# Patient Record
Sex: Female | Born: 1995 | Race: Black or African American | Hispanic: No | Marital: Single | Smoking: Never smoker
Health system: Southern US, Community
[De-identification: ages and names within clinical notes are randomized; demographics above are authoritative.]

## PROBLEM LIST (undated history)

## (undated) ENCOUNTER — Ambulatory Visit: Discharge status: Absent without leave | Payer: Medicaid Other

## (undated) DIAGNOSIS — L732 Hidradenitis suppurativa: Secondary | ICD-10-CM

## (undated) HISTORY — DX: Hidradenitis suppurativa: L73.2

---
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
## 2015-08-09 IMAGING — CR DG CHEST 2V
2 series · 2 of 2 positions shown · non-contrast
Comparison: None.

CLINICAL DATA: Mid chest pain.

EXAM:
CHEST  2 VIEW

[w chest pa]
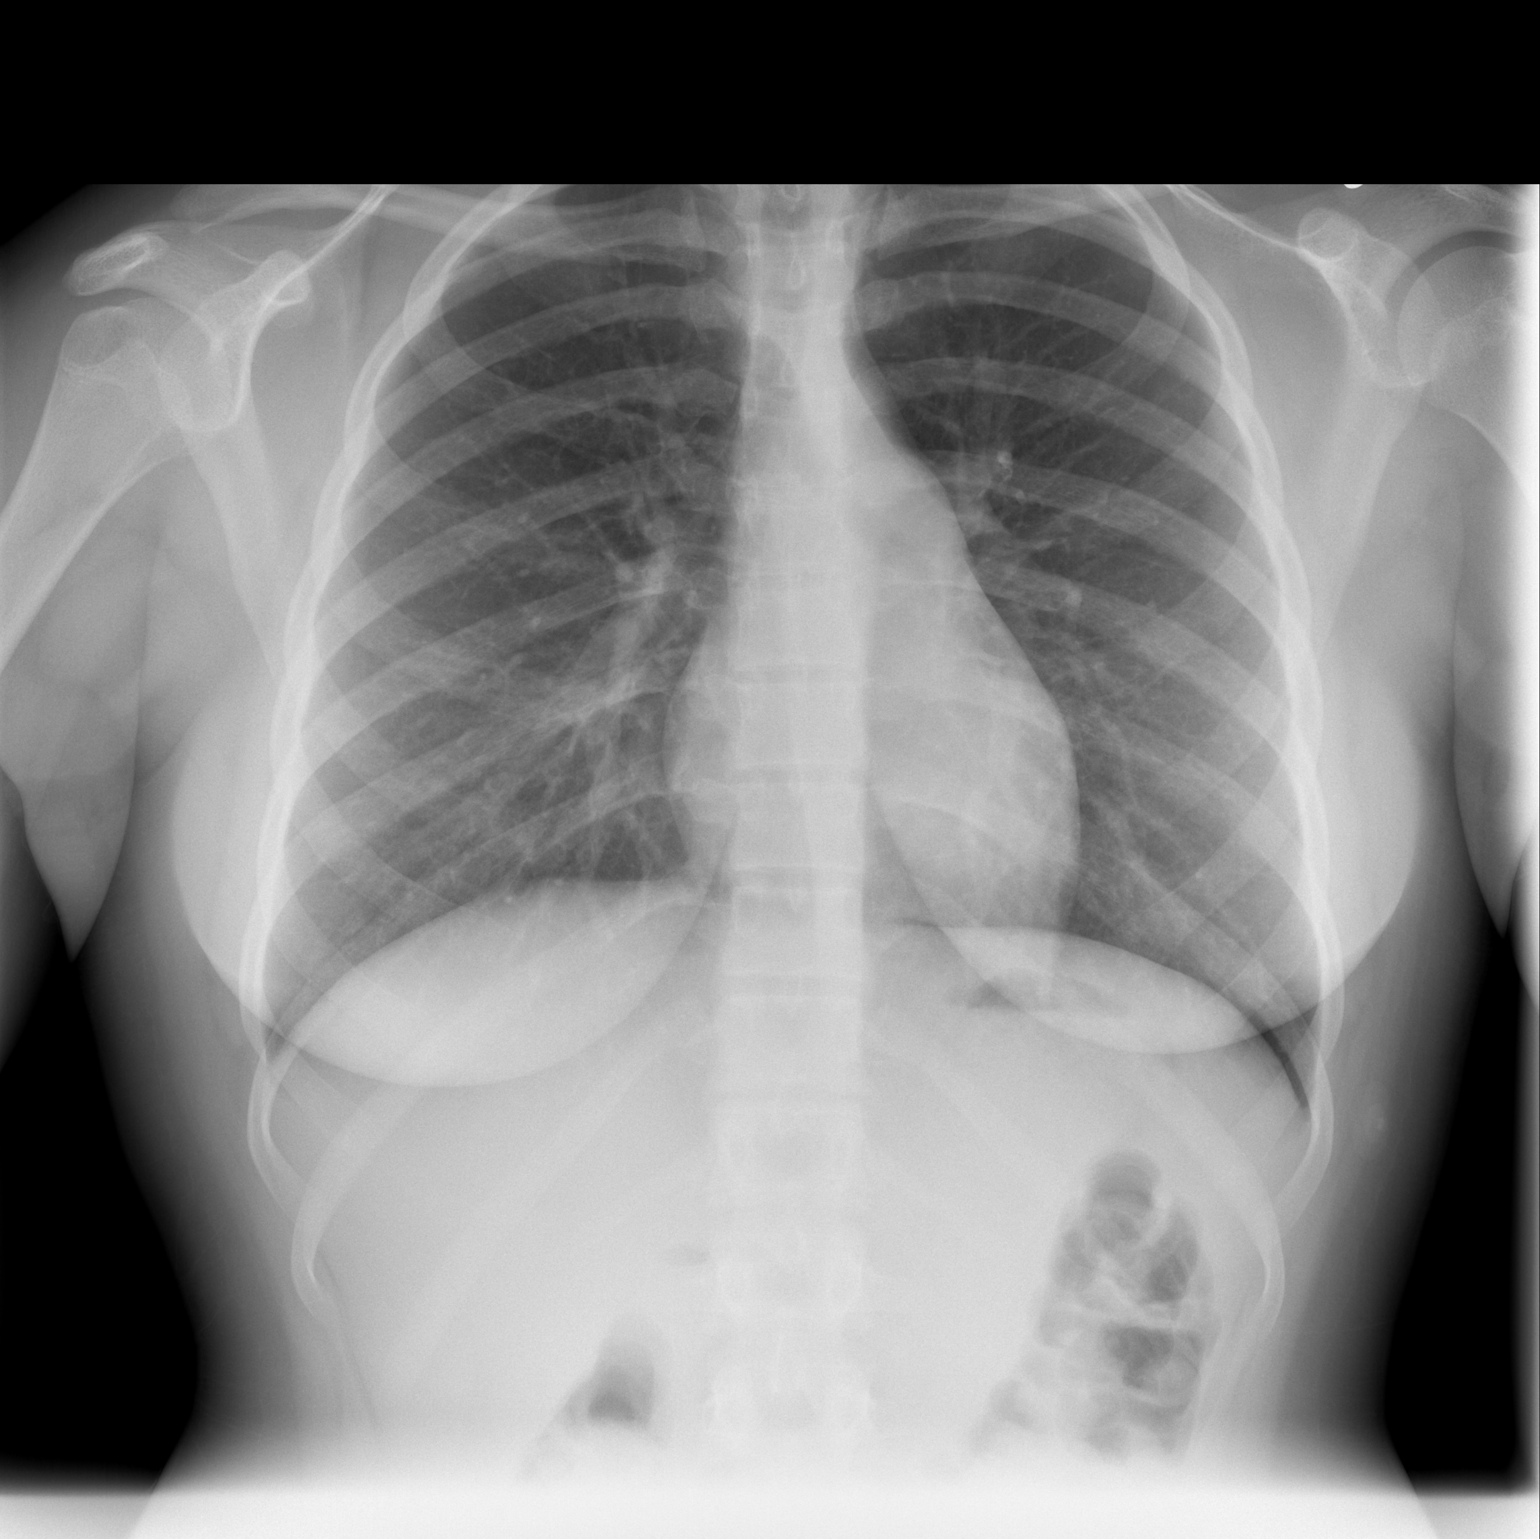

[w chest lat]
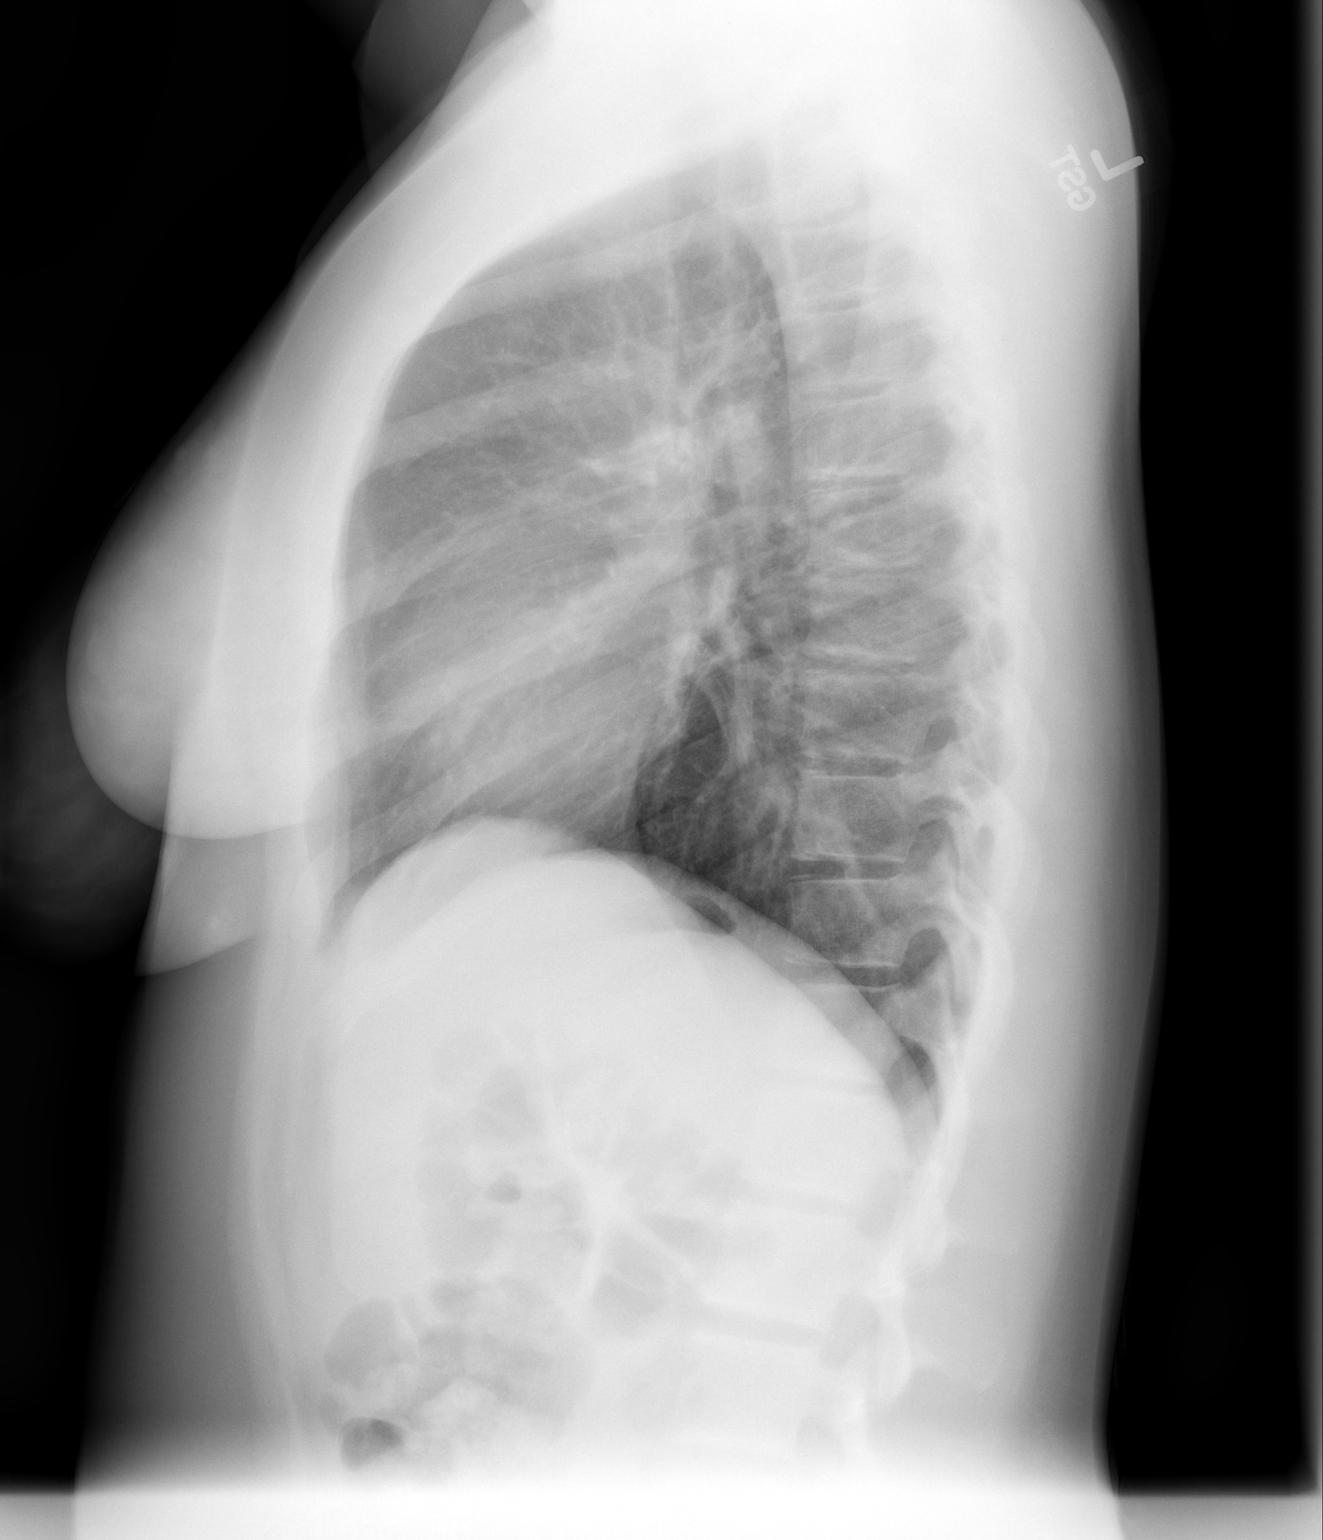

[2 of 2 positions shown; findings below may reference images not displayed]

FINDINGS: The lungs are well-aerated and clear. There is no evidence of focal
opacification, pleural effusion or pneumothorax.

The heart is normal in size; the mediastinal contour is within
normal limits. No acute osseous abnormalities are seen.
IMPRESSION: No acute cardiopulmonary process seen.

## 2015-10-29 ENCOUNTER — Ambulatory Visit (HOSPITAL_COMMUNITY)
Admission: EM | Admit: 2015-10-29 | Discharge: 2015-10-29 | Disposition: A | Discharge status: Home / Self Care | Payer: 59 | Attending: Internal Medicine | Admitting: Internal Medicine

## 2015-10-29 ENCOUNTER — Encounter (HOSPITAL_COMMUNITY): Payer: Self-pay | Admitting: Emergency Medicine

## 2015-10-29 DIAGNOSIS — R350 Frequency of micturition: Secondary | ICD-10-CM | POA: Insufficient documentation

## 2015-10-29 DIAGNOSIS — N898 Other specified noninflammatory disorders of vagina: Secondary | ICD-10-CM

## 2015-10-29 DIAGNOSIS — R109 Unspecified abdominal pain: Secondary | ICD-10-CM | POA: Diagnosis present

## 2015-10-29 LAB — POCT PREGNANCY, URINE: Preg Test, Ur: NEGATIVE

## 2015-10-29 LAB — POCT URINALYSIS DIP (DEVICE)
Glucose, UA: NEGATIVE mg/dL
HGB URINE DIPSTICK: NEGATIVE
KETONES UR: NEGATIVE mg/dL
Leukocytes, UA: NEGATIVE
Nitrite: NEGATIVE
PH: 6 (ref 5.0–8.0)
Protein, ur: NEGATIVE mg/dL
SPECIFIC GRAVITY, URINE: 1.025 (ref 1.005–1.030)
Urobilinogen, UA: 1 mg/dL (ref 0.0–1.0)

## 2015-10-29 MED ORDER — FLUCONAZOLE 150 MG PO TABS: 150.0000 mg | ORAL_TABLET | Freq: Every day | ORAL | 1 refills | Status: DC

## 2015-10-29 NOTE — ED Triage Notes (Signed)
The patient presented to the [facility] with a complaint of lower abdominal pain and urinary frequency for about 1 week. The patient also requested an STD check.

## 2015-10-29 NOTE — ED Provider Notes (Addendum)
[facility]    CSN: [id number] Arrival date & time: 10/29/15  1452  First Provider Contact:  None       History   Chief Complaint Chief Complaint  Patient presents with  . Abdominal Pain  . Urinary Frequency    HPI Shelly Mccann is a 20 y.o. female.   The patient complains of pelvic discomfort and white discharge x4 days.  She is sexually active.  Denies nausea, vomiting or diarrhea.  She thinks she has seen blood in her urine and admits to urinary frequency and urgency.   The history is provided by the patient.  Abdominal Pain  Pain location:  Suprapubic Pain quality: aching   Pain radiates to:  Does not radiate Onset quality:  Unable to specify Progression:  Unchanged Chronicity:  New Worsened by:  Urination Ineffective treatments:  None tried Associated symptoms: no diarrhea, no fever, no nausea and no vomiting   Urinary Frequency  Associated symptoms include abdominal pain.    History reviewed. No pertinent past medical history.  Patient Active Problem List   Diagnosis Date Noted  . S/P tonsillectomy 10/03/2013    Past Surgical History:  Procedure Laterality Date  . TONSILLECTOMY N/A 10/03/2013   Procedure: TONSILLECTOMY;  Surgeon: Serena Colonel, MD;  Location: [facility];  Service: ENT;  Laterality: N/A;    OB History    No data available       Home Medications    Prior to Admission medications   Medication Sig Start Date End Date Taking? Authorizing Provider  fluconazole (DIFLUCAN) 150 MG tablet Take 1 tablet (150 mg total) by mouth daily. Take 1 tab today, then take the last tablet AFTER completing antibiotic regimen 10/29/15   Arnaldo Natal, MD  HYDROcodone-acetaminophen (HYCET) 7.5-325 mg/15 ml solution Take 15 mLs by mouth 4 (four) times daily as needed for moderate pain. 10/03/13   Serena Colonel, MD  promethazine (PHENERGAN) 25 MG suppository Place 1 suppository (25 mg total) rectally every 6 (six) hours as needed for  nausea or vomiting. 10/03/13   Serena Colonel, MD    Family History Family History  Problem Relation Age of Onset  . Alcohol abuse Paternal Grandfather   . Hypertension Paternal Grandfather     Social History Social History  Substance Use Topics  . Smoking status: Never Smoker  . Smokeless tobacco: Never Used  . Alcohol use No     Allergies   Review of patient's allergies indicates no known allergies.   Review of Systems Review of Systems  Constitutional: Negative for fever.  Gastrointestinal: Positive for abdominal pain. Negative for diarrhea, nausea and vomiting.  Genitourinary: Positive for frequency.     Physical Exam Triage Vital Signs ED Triage Vitals [10/29/15 1522]  Enc Vitals Group     BP 112/73     Pulse Rate 83     Resp 12     Temp 97.6 F (36.4 C)     Temp Source Oral     SpO2 100 %     Weight      Height      Head Circumference      Peak Flow      Pain Score      Pain Loc      Pain Edu?      Excl. in GC?    No data found.   Updated Vital Signs BP 112/73 (BP Location: Left Arm)   Pulse 83   Temp 97.6 F (36.4  C) (Oral)   Resp 12   LMP 10/08/2015 (Exact Date)   SpO2 100%   Visual Acuity Right Eye Distance:   Left Eye Distance:   Bilateral Distance:    Right Eye Near:   Left Eye Near:    Bilateral Near:     Physical Exam  Constitutional: She appears well-developed and well-nourished. No distress.  HENT:  Head: Normocephalic and atraumatic.  Eyes: Conjunctivae are normal.  Neck: Neck supple.  Cardiovascular: Normal rate and regular rhythm.   No murmur heard. Pulmonary/Chest: Effort normal and breath sounds normal. No respiratory distress.  Abdominal: Soft. She exhibits no distension. There is no tenderness.  Genitourinary: Vagina normal. Vaginal discharge: white milky.  Musculoskeletal: She exhibits no edema.  Neurological: She is alert.  Skin: Skin is warm and dry.  Psychiatric: She has a normal mood and affect.  Nursing  note and vitals reviewed.    UC Treatments / Results  Labs (all labs ordered are listed, but only abnormal results are displayed) Labs Reviewed  POCT URINALYSIS DIP (DEVICE) - Abnormal; Notable for the following:       Result Value   Bilirubin Urine SMALL (*)    All other components within normal limits  CERVICOVAGINAL ANCILLARY ONLY - Abnormal; Notable for the following:    Wet Prep (BD Affirm) **POSITIVE for Gardnerella** (*)    All other components within normal limits  HIV ANTIBODY (ROUTINE TESTING)  POCT PREGNANCY, URINE  CERVICOVAGINAL ANCILLARY ONLY    EKG  EKG Interpretation None       Radiology No results found.  Procedures Procedures (including critical care time)  Medications Ordered in UC Medications - No data to display   Initial Impression / Assessment and Plan / UC Course  I have reviewed the triage vital signs and the nursing notes.  Pertinent labs & imaging results that were available during my care of the patient were reviewed by me and considered in my medical decision making (see chart for details).  Clinical Course    Awaiting wet prep and vaginal cultures for GC/Chlamydia. Highly likely to have yeast infection.  Given Diflucan for today and for after antibiotics (assuming she needs a course).  Will call in medications pending results of swabs.  Prep positive for bacterial vaginosis. Flagyl 500 mg by mouth twice a day called in to [facility]. Final Clinical Impressions(s) / UC Diagnoses   Final diagnoses:  Vaginal discharge    New Prescriptions Discharge Medication List as of 10/29/2015  5:25 PM    START taking these medications   Details  fluconazole (DIFLUCAN) 150 MG tablet Take 1 tablet (150 mg total) by mouth daily. Take 1 tab today, then take the last tablet AFTER completing antibiotic regimen, Starting Mon 10/29/2015, Print         Arnaldo Natal, MD 10/30/15 0144    Arnaldo Natal, MD 11/01/15  [phone]

## 2015-10-30 LAB — HIV ANTIBODY (ROUTINE TESTING W REFLEX): HIV Screen 4th Generation wRfx: NONREACTIVE

## 2015-10-30 LAB — CERVICOVAGINAL ANCILLARY ONLY
Chlamydia: NEGATIVE
Neisseria Gonorrhea: NEGATIVE
Wet Prep (BD Affirm): POSITIVE — AB

## 2015-11-01 ENCOUNTER — Telehealth (HOSPITAL_COMMUNITY): Payer: Self-pay | Admitting: *Deleted

## 2015-11-01 MED ORDER — METRONIDAZOLE 500 MG PO TABS: 500.0000 mg | ORAL_TABLET | Freq: Two times a day (BID) | ORAL | 0 refills | Status: DC

## 2019-06-16 ENCOUNTER — Emergency Department (HOSPITAL_COMMUNITY)
Admission: EM | Admit: 2019-06-16 | Discharge: 2019-06-16 | Disposition: A | Discharge status: Home / Self Care | Payer: Medicaid - Out of State | Attending: Emergency Medicine | Admitting: Emergency Medicine

## 2019-06-16 ENCOUNTER — Other Ambulatory Visit: Payer: Self-pay

## 2019-06-16 ENCOUNTER — Encounter (HOSPITAL_COMMUNITY): Payer: Self-pay | Admitting: Emergency Medicine

## 2019-06-16 DIAGNOSIS — N926 Irregular menstruation, unspecified: Secondary | ICD-10-CM | POA: Diagnosis not present

## 2019-06-16 DIAGNOSIS — N939 Abnormal uterine and vaginal bleeding, unspecified: Secondary | ICD-10-CM | POA: Diagnosis present

## 2019-06-16 LAB — CBC
HCT: 42.6 % (ref 36.0–46.0)
Hemoglobin: 14.3 g/dL (ref 12.0–15.0)
MCH: 30.6 pg (ref 26.0–34.0)
MCHC: 33.6 g/dL (ref 30.0–36.0)
MCV: 91 fL (ref 80.0–100.0)
Platelets: 215 10*3/uL (ref 150–400)
RBC: 4.68 MIL/uL (ref 3.87–5.11)
RDW: 12.1 % (ref 11.5–15.5)
WBC: 8.2 10*3/uL (ref 4.0–10.5)
nRBC: 0 % (ref 0.0–0.2)

## 2019-06-16 LAB — BASIC METABOLIC PANEL
Anion gap: 7 (ref 5–15)
BUN: 10 mg/dL (ref 6–20)
CO2: 27 mmol/L (ref 22–32)
Calcium: 9.1 mg/dL (ref 8.9–10.3)
Chloride: 105 mmol/L (ref 98–111)
Creatinine, Ser: 0.73 mg/dL (ref 0.44–1.00)
GFR calc Af Amer: >60 mL/min (ref >60)
GFR calc non Af Amer: >60 mL/min (ref >60)
Glucose, Bld: 108 mg/dL — ABNORMAL HIGH (ref 70–99)
Potassium: 3.4 mmol/L — ABNORMAL LOW (ref 3.5–5.1)
Sodium: 139 mmol/L (ref 135–145)

## 2019-06-16 LAB — ABO/RH: ABO/RH(D): A POS

## 2019-06-16 LAB — WET PREP, GENITAL
Clue Cells Wet Prep HPF POC: NONE SEEN
Sperm: NONE SEEN
Trich, Wet Prep: NONE SEEN
Yeast Wet Prep HPF POC: NONE SEEN

## 2019-06-16 LAB — HCG, QUANTITATIVE, PREGNANCY: hCG, Beta Chain, Quant, S: 1 m[IU]/mL (ref <5)

## 2019-06-16 NOTE — Discharge Instructions (Addendum)
Your blood test was negative for pregnancy. This is likely your normal menstrual cycle. Get help right away if: You develop a fever or chills. You need to change your sanitary pad or tampon more than one time per hour. Your bleeding becomes heavy. Your flow contains clots. You develop pain in your abdomen. You lose consciousness. You develop a rash.

## 2019-06-16 NOTE — ED Triage Notes (Signed)
Had positive pregnancy last week and has had some spotting since.

## 2019-06-16 NOTE — ED Provider Notes (Signed)
[facility] Provider Note   CSN: [id number] Arrival date & time: 06/16/19  1144     History No chief complaint on file.   Shelly Mccann is a 24 y.o. female.  HPI  Shelly Mccann is a 24 y.o. female Here with Pos preg test last Friday and c/o vaginal bleeding starting on  Saturday the 11. She began having light and cramping. Bleeding has steadily decreased. Using 2 panty liners a day. She has had 1 previous pregnancy resulting in miscarriage. She denies urinary symptoms. No hx of STI's.  LMP Feb. 7, 2021.     History reviewed. No pertinent past medical history.  Patient Active Problem List   Diagnosis Date Noted  . S/P tonsillectomy 10/03/2013    Past Surgical History:  Procedure Laterality Date  . TONSILLECTOMY N/A 10/03/2013   Procedure: TONSILLECTOMY;  Surgeon: Izora Gala, MD;  Location: [facility];  Service: ENT;  Laterality: N/A;     OB History   No obstetric history on file.     Family History  Problem Relation Age of Onset  . Alcohol abuse Paternal Grandfather   . Hypertension Paternal Grandfather     Social History   Tobacco Use  . Smoking status: Never Smoker  . Smokeless tobacco: Never Used  Substance Use Topics  . Alcohol use: No  . Drug use: Not Currently    Types: Marijuana    Home Medications Prior to Admission medications   Medication Sig Start Date End Date Taking? Authorizing Provider  fluconazole (DIFLUCAN) 150 MG tablet Take 1 tablet (150 mg total) by mouth daily. Take 1 tab today, then take the last tablet AFTER completing antibiotic regimen Patient not taking: Reported on 06/16/2019 10/29/15   Harrie Foreman, MD  HYDROcodone-acetaminophen (HYCET) 7.5-325 mg/15 ml solution Take 15 mLs by mouth 4 (four) times daily as needed for moderate pain. Patient not taking: Reported on 06/16/2019 10/03/13   Izora Gala, MD  metroNIDAZOLE (FLAGYL) 500 MG tablet Take 1 tablet (500 mg total) by mouth 2 (two)  times daily. Patient not taking: Reported on 06/16/2019 11/01/15   Melony Overly, MD  promethazine (PHENERGAN) 25 MG suppository Place 1 suppository (25 mg total) rectally every 6 (six) hours as needed for nausea or vomiting. Patient not taking: Reported on 06/16/2019 10/03/13   Izora Gala, MD    Allergies    Patient has no known allergies.  Review of Systems   Review of Systems Ten systems reviewed and are negative for acute change, except as noted in the HPI.   Physical Exam Updated Vital Signs BP (!) 104/94   Pulse 61   Temp 98.3 F (36.8 C) (Oral)   Resp 15   Ht 5\' 9"  (1.753 m)   Wt 104.3 kg   LMP 05/15/2019   SpO2 97%   BMI 33.97 kg/m   Physical Exam Vitals and nursing note reviewed.  Constitutional:      General: She is not in acute distress.    Appearance: She is well-developed. She is not diaphoretic.  HENT:     Head: Normocephalic and atraumatic.  Eyes:     General: No scleral icterus.    Conjunctiva/sclera: Conjunctivae normal.  Cardiovascular:     Rate and Rhythm: Normal rate and regular rhythm.     Heart sounds: Normal heart sounds. No murmur. No friction rub. No gallop.   Pulmonary:     Effort: Pulmonary effort is normal. No respiratory distress.  Breath sounds: Normal breath sounds.  Abdominal:     General: Bowel sounds are normal. There is no distension.     Palpations: Abdomen is soft. There is no mass.     Tenderness: There is no abdominal tenderness. There is no guarding.  Genitourinary:    Comments: Exam chaperoned Pelvic exam: normal external genitalia, vulva, vagina, cervix, uterus and adnexa. Mild bleeding from cervical os. No clots. Musculoskeletal:     Cervical back: Normal range of motion.  Skin:    General: Skin is warm and dry.  Neurological:     Mental Status: She is alert and oriented to person, place, and time.  Psychiatric:        Behavior: Behavior normal.     ED Results / Procedures / Treatments   Labs (all labs ordered  are listed, but only abnormal results are displayed) Labs Reviewed  CBC  HCG, QUANTITATIVE, PREGNANCY  BASIC METABOLIC PANEL  ABO/RH  GC/CHLAMYDIA PROBE AMP ([place]) NOT AT [facility]    EKG None  Radiology No results found.  Procedures Procedures (including critical care time)  Medications Ordered in ED Medications - No data to display  ED Course  I have reviewed the triage vital signs and the nursing notes.  Pertinent labs & imaging results that were available during my care of the patient were reviewed by me and considered in my medical decision making (see chart for details).  Clinical Course as of Jun 15 2105  Thu Jun 16, 2019  1408 HCG, Beta Chain, Quant, S: 1 [AH]  1409 HCG, Beta Chain, Quant, S: 1 [AH]    Clinical Course User Index [AH] Arthor Captain, PA-C   MDM Rules/Calculators/A&P                      24 year old female presents emergency department with chief complaint of vaginal bleeding.  She had a positive pregnancy test at home however review of the labs shows negative quantitative hCG patient had a few white cells on her wet prep but no other abnormalities.  She has a benign pelvic examination without pain.  Likely the patient bleeding is just normal menstruation.  Given her negative pregnancy test there is no concern for ectopic pregnancy, subchorionic hemorrhage or other early cause of first trimester bleeding.  Patient also has a benign pressure exam so I have low suspicion for PID or tubo-ovarian abscess.  She has no pelvic pain.  Patient appears appropriate for discharge at this time with outpatient follow-up.  Remainder of the patient's labs are within normal limits. Final Clinical Impression(s) / ED Diagnoses Final diagnoses:  None    Rx / DC Orders ED Discharge Orders    None       Arthor Captain, PA-C 06/16/19 2110    Pollyann Savoy, MD 06/17/19 [phone]

## 2019-06-17 LAB — GC/CHLAMYDIA PROBE AMP (~~LOC~~) NOT AT ARMC
Chlamydia: NEGATIVE
Neisseria Gonorrhea: NEGATIVE

## 2019-11-07 ENCOUNTER — Ambulatory Visit
Admission: EM | Admit: 2019-11-07 | Discharge: 2019-11-07 | Disposition: A | Discharge status: Home / Self Care | Payer: Medicaid - Out of State | Attending: Emergency Medicine | Admitting: Emergency Medicine

## 2019-11-07 ENCOUNTER — Other Ambulatory Visit: Payer: Self-pay

## 2019-11-07 DIAGNOSIS — N898 Other specified noninflammatory disorders of vagina: Secondary | ICD-10-CM | POA: Diagnosis present

## 2019-11-07 DIAGNOSIS — Z113 Encounter for screening for infections with a predominantly sexual mode of transmission: Secondary | ICD-10-CM | POA: Diagnosis present

## 2019-11-07 MED ORDER — FLUCONAZOLE 200 MG PO TABS
ORAL_TABLET | ORAL | 0 refills | Status: DC
Start: 2019-11-07 — End: 2020-03-12

## 2019-11-07 MED ORDER — METRONIDAZOLE 500 MG PO TABS: 500.0000 mg | ORAL_TABLET | Freq: Two times a day (BID) | ORAL | 0 refills | Status: DC

## 2019-11-07 NOTE — ED Triage Notes (Signed)
Pt presents to UC for STD's testing. State shaving vaginal discharge.

## 2019-11-07 NOTE — Discharge Instructions (Signed)
Vaginal self-swab obtained.  We will follow up with you regarding abnormal results HIV/ syphilis testing today Prescribed metronidazole 500 mg twice daily for 7 days (do not take while consuming alcohol and/or if breastfeeding) Prescribed diflucan 200 mg once daily and then second dose 72 hours later Take medications as prescribed and to completion If tests results are positive, please abstain from sexual activity until you and your partner(s) have been treated Follow up with PCP as needed Return here or go to ER if you have any new or worsening symptoms fever, chills, nausea, vomiting, abdominal or pelvic pain, painful intercourse, vaginal discharge, vaginal bleeding, persistent symptoms despite treatment, etc..Marland Kitchen

## 2019-11-07 NOTE — ED Provider Notes (Signed)
[facility] CARE CENTER   [id number] 11/07/19 Arrival Time: 1831   CC: VAGINAL DISCHARGE  SUBJECTIVE:  Shelly Mccann is a 24 y.o. female who presents with complaints of vaginal discharge and odor x 1 week.  Last sex 2 weeks ago.  Has been sexually active with 1 female partner over the past 6 months.  Partner without symptoms.  She has tried OTC medications without relief.  She reports worsening symptoms with sensitive soap.  She reports similar symptoms in the past and was diagnosed with BV and yeast.  She denies fever, chills, nausea, vomiting, abdominal or pelvic pain, urinary symptoms, vaginal itching, vaginal bleeding, dyspareunia, vaginal rashes or lesions.   LMP: 10/24/19 Not on BC  ROS: As per HPI.  All other pertinent ROS negative.     History reviewed. No pertinent past medical history. Past Surgical History:  Procedure Laterality Date  . TONSILLECTOMY N/A 10/03/2013   Procedure: TONSILLECTOMY;  Surgeon: Serena Colonel, MD;  Location: [facility];  Service: ENT;  Laterality: N/A;   No Known Allergies No current facility-administered medications on file prior to encounter.   Current Outpatient Medications on File Prior to Encounter  Medication Sig Dispense Refill  . [DISCONTINUED] promethazine (PHENERGAN) 25 MG suppository Place 1 suppository (25 mg total) rectally every 6 (six) hours as needed for nausea or vomiting. (Patient not taking: Reported on 06/16/2019) 12 suppository 1    Social History   Socioeconomic History  . Marital status: Single    Spouse name: Not on file  . Number of children: Not on file  . Years of education: Not on file  . Highest education level: Not on file  Occupational History  . Not on file  Tobacco Use  . Smoking status: Never Smoker  . Smokeless tobacco: Never Used  Substance and Sexual Activity  . Alcohol use: No  . Drug use: Not Currently    Types: Marijuana  . Sexual activity: Never    Birth control/protection: Implant     Comment: pt has norplant in arm for birth control  Other Topics Concern  . Not on file  Social History Narrative  . Not on file   Social Determinants of Health   Financial Resource Strain:   . Difficulty of Paying Living Expenses:   Food Insecurity:   . Worried About Programme researcher, broadcasting/film/video in the Last Year:   . Barista in the Last Year:   Transportation Needs:   . Freight forwarder (Medical):   Marland Kitchen Lack of Transportation (Non-Medical):   Physical Activity:   . Days of Exercise per Week:   . Minutes of Exercise per Session:   Stress:   . Feeling of Stress :   Social Connections:   . Frequency of Communication with Friends and Family:   . Frequency of Social Gatherings with Friends and Family:   . Attends Religious Services:   . Active Member of Clubs or Organizations:   . Attends Banker Meetings:   Marland Kitchen Marital Status:   Intimate Partner Violence:   . Fear of Current or Ex-Partner:   . Emotionally Abused:   Marland Kitchen Physically Abused:   . Sexually Abused:    Family History  Problem Relation Age of Onset  . Alcohol abuse Paternal Grandfather   . Hypertension Paternal Grandfather     OBJECTIVE:  Vitals:   11/07/19 1853  BP: 111/76  Pulse: (!) 57  Resp: 16  Temp: 98.3 F (36.8 C)  TempSrc:  Oral  SpO2: 98%     General appearance: Alert, NAD, appears stated age Head: NCAT Throat: lips, mucosa, and tongue normal; teeth and gums normal Lungs: CTA bilaterally without adventitious breath sounds Heart: regular rate and rhythm.   Back: no CVA tenderness Abdomen: soft, non-tender; bowel sounds normal; no guarding or rebound tenderness GU: deferred Skin: warm and dry Psychological:  Alert and cooperative. Normal mood and affect.  ASSESSMENT & PLAN:  1. Vaginal discharge   2. Vaginal odor   3. Screening examination for venereal disease     Meds ordered this encounter  Medications  . metroNIDAZOLE (FLAGYL) 500 MG tablet    Sig: Take 1 tablet (500  mg total) by mouth 2 (two) times daily.    Dispense:  14 tablet    Refill:  0    Order Specific Question:   Supervising Provider    Answer:   Eustace Moore [[id number]]  . fluconazole (DIFLUCAN) 200 MG tablet    Sig: Take one dose by mouth, wait 72 hours, and then take second dose by mouth    Dispense:  2 tablet    Refill:  0    Order Specific Question:   Supervising Provider    Answer:   Eustace Moore [[id number]]    Vaginal self-swab obtained.  We will follow up with you regarding abnormal results HIV/ syphilis testing today Prescribed metronidazole 500 mg twice daily for 7 days (do not take while consuming alcohol and/or if breastfeeding) Prescribed diflucan 200 mg once daily and then second dose 72 hours later Take medications as prescribed and to completion If tests results are positive, please abstain from sexual activity until you and your partner(s) have been treated Follow up with PCP as needed Return here or go to ER if you have any new or worsening symptoms fever, chills, nausea, vomiting, abdominal or pelvic pain, painful intercourse, vaginal discharge, vaginal bleeding, persistent symptoms despite treatment, etc...  Reviewed expectations re: course of current medical issues. Questions answered. Outlined signs and symptoms indicating need for more acute intervention. Patient verbalized understanding. After Visit Summary given.       Rennis Harding, PA-C 11/07/19 1901

## 2019-11-08 LAB — CERVICOVAGINAL ANCILLARY ONLY
Bacterial Vaginitis (gardnerella): POSITIVE — AB
Candida Glabrata: NEGATIVE
Candida Vaginitis: NEGATIVE
Chlamydia: NEGATIVE
Comment: NEGATIVE
Comment: NEGATIVE
Comment: NEGATIVE
Comment: NEGATIVE
Comment: NEGATIVE
Comment: NORMAL
Neisseria Gonorrhea: NEGATIVE
Trichomonas: NEGATIVE

## 2019-11-08 LAB — HIV ANTIBODY (ROUTINE TESTING W REFLEX): HIV Screen 4th Generation wRfx: NONREACTIVE

## 2019-11-08 LAB — RPR: RPR Ser Ql: NONREACTIVE

## 2020-01-28 ENCOUNTER — Encounter: Payer: Self-pay | Admitting: Emergency Medicine

## 2020-01-28 ENCOUNTER — Other Ambulatory Visit: Payer: Self-pay

## 2020-01-28 ENCOUNTER — Ambulatory Visit
Admission: EM | Admit: 2020-01-28 | Discharge: 2020-01-28 | Disposition: A | Discharge status: Home / Self Care | Payer: Medicaid Other | Attending: Emergency Medicine | Admitting: Emergency Medicine

## 2020-01-28 DIAGNOSIS — Z206 Contact with and (suspected) exposure to human immunodeficiency virus [HIV]: Secondary | ICD-10-CM | POA: Diagnosis present

## 2020-01-28 DIAGNOSIS — Z113 Encounter for screening for infections with a predominantly sexual mode of transmission: Secondary | ICD-10-CM | POA: Diagnosis present

## 2020-01-28 NOTE — ED Triage Notes (Signed)
Patient states that she is in the middle of having an abortion now- taking the pills now. just wants to get checked for stds. no current symptoms. wants to do the swab

## 2020-01-28 NOTE — Discharge Instructions (Addendum)
Vaginal self swab obtained Take medications as prescribed and to completion We will follow up with you regarding the results of your test If tests are positive, please abstain from sexual activity until you and your partner(s) are treated Follow up with PCP or [facility] if symptoms persists Return here or go to ER if you have any new or worsening symptoms

## 2020-01-28 NOTE — ED Provider Notes (Signed)
[facility] CARE CENTER   [id number] 01/28/20 Arrival Time: 1542   IP:JASNKNL FOR STD  SUBJECTIVE:  Shelly Mccann is a 24 y.o. female who presented to the urgent care with a complaint of STD screening.  Patient is asymptomatic.  Last unprotected sexual encounter within a week.  Sexually active with 1 female partner.  Denies similar symptoms in the past.  Denies fever, chills, nausea, vomiting, abdominal or pelvic pain, urinary symptoms, vaginal itching, vaginal odor, vaginal bleeding, dyspareunia, vaginal rashes or lesions.    No LMP recorded.  ROS: As per HPI.  All other pertinent ROS negative.     History reviewed. No pertinent past medical history. Past Surgical History:  Procedure Laterality Date   TONSILLECTOMY N/A 10/03/2013   Procedure: TONSILLECTOMY;  Surgeon: Serena Colonel, MD;  Location: [facility];  Service: ENT;  Laterality: N/A;   No Known Allergies No current facility-administered medications on file prior to encounter.   Current Outpatient Medications on File Prior to Encounter  Medication Sig Dispense Refill   fluconazole (DIFLUCAN) 200 MG tablet Take one dose by mouth, wait 72 hours, and then take second dose by mouth 2 tablet 0   metroNIDAZOLE (FLAGYL) 500 MG tablet Take 1 tablet (500 mg total) by mouth 2 (two) times daily. 14 tablet 0   [DISCONTINUED] promethazine (PHENERGAN) 25 MG suppository Place 1 suppository (25 mg total) rectally every 6 (six) hours as needed for nausea or vomiting. (Patient not taking: Reported on 06/16/2019) 12 suppository 1   Social History   Socioeconomic History   Marital status: Single    Spouse name: Not on file   Number of children: Not on file   Years of education: Not on file   Highest education level: Not on file  Occupational History   Not on file  Tobacco Use   Smoking status: Never Smoker   Smokeless tobacco: Never Used  Substance and Sexual Activity   Alcohol use: No   Drug use: Not  Currently    Types: Marijuana   Sexual activity: Never    Birth control/protection: Implant    Comment: pt has norplant in arm for birth control  Other Topics Concern   Not on file  Social History Narrative   Not on file   Social Determinants of Health   Financial Resource Strain:    Difficulty of Paying Living Expenses: Not on file  Food Insecurity:    Worried About Programme researcher, broadcasting/film/video in the Last Year: Not on file   [employer] of Food in the Last Year: Not on file  Transportation Needs:    Lack of Transportation (Medical): Not on file   Lack of Transportation (Non-Medical): Not on file  Physical Activity:    Days of Exercise per Week: Not on file   Minutes of Exercise per Session: Not on file  Stress:    Feeling of Stress : Not on file  Social Connections:    Frequency of Communication with Friends and Family: Not on file   Frequency of Social Gatherings with Friends and Family: Not on file   Attends Religious Services: Not on file   Active Member of Clubs or Organizations: Not on file   Attends Banker Meetings: Not on file   Marital Status: Not on file  Intimate Partner Violence:    Fear of Current or Ex-Partner: Not on file   Emotionally Abused: Not on file   Physically Abused: Not on file   Sexually Abused: Not  on file   Family History  Problem Relation Age of Onset   Alcohol abuse Paternal Grandfather    Hypertension Paternal Grandfather     OBJECTIVE:  Vitals:   01/28/20 1549  BP: 110/77  Pulse: 64  Resp: 16  Temp: 98.2 F (36.8 C)  SpO2: 99%     General appearance: alert, NAD, appears stated age Head: NCAT Throat: lips, mucosa, and tongue normal; teeth and gums normal Lungs: CTA bilaterally without adventitious breath sounds Heart: regular rate and rhythm.  Radial pulses 2+ symmetrical bilaterally Back: no CVA tenderness Abdomen: soft, non-tender; bowel sounds normal; no masses or organomegaly; no guarding or  rebound tenderness GU: Cervical self swab was obtained  skin: warm and dry Psychological:  Alert and cooperative. Normal mood and affect.  LABS:  Results for orders placed or performed during the hospital encounter of 11/07/19  RPR  Result Value Ref Range   RPR Ser Ql Non Reactive Non Reactive  HIV Antibody (routine testing w rflx)  Result Value Ref Range   HIV Screen 4th Generation wRfx Non Reactive Non Reactive  Cervicovaginal ancillary only  Result Value Ref Range   Bacterial Vaginitis (gardnerella) Positive (A)    Candida Vaginitis Negative    Candida Glabrata Negative    Trichomonas Negative    Chlamydia Negative    Neisseria Gonorrhea Negative    Comment      Normal Reference Range Bacterial Vaginosis - Negative   Comment Normal Reference Range Candida Species - Negative    Comment Normal Reference Range Candida Galbrata - Negative    Comment Normal Reference Range Trichomonas - Negative    Comment Normal Reference Ranger Chlamydia - Negative    Comment      Normal Reference Range Neisseria Gonorrhea - Negative    Labs Reviewed  HIV ANTIBODY (ROUTINE TESTING W REFLEX)  CERVICOVAGINAL ANCILLARY ONLY    ASSESSMENT & PLAN:  1. HIV exposure   2. Screening for STD (sexually transmitted disease)     No orders of the defined types were placed in this encounter.   Pending: Labs Reviewed  HIV ANTIBODY (ROUTINE TESTING W REFLEX)  CERVICOVAGINAL ANCILLARY ONLY   Discharge instructions  Vaginal self swab obtained Take medications as prescribed and to completion We will follow up with you regarding the results of your test If tests are positive, please abstain from sexual activity until you and your partner(s) are treated Follow up with PCP or [facility] if symptoms persists Return here or go to ER if you have any new or worsening symptoms    Reviewed expectations re: course of current medical issues. Questions answered. Outlined signs and symptoms  indicating need for more acute intervention. Patient verbalized understanding. After Visit Summary given.       Durward Parcel, FNP 01/28/20 1626

## 2020-01-30 LAB — CERVICOVAGINAL ANCILLARY ONLY
Bacterial Vaginitis (gardnerella): POSITIVE — AB
Candida Glabrata: NEGATIVE
Candida Vaginitis: NEGATIVE
Chlamydia: NEGATIVE
Comment: NEGATIVE
Comment: NEGATIVE
Comment: NEGATIVE
Comment: NEGATIVE
Comment: NEGATIVE
Comment: NORMAL
Neisseria Gonorrhea: NEGATIVE
Trichomonas: NEGATIVE

## 2020-01-30 LAB — HIV ANTIBODY (ROUTINE TESTING W REFLEX): HIV Screen 4th Generation wRfx: NONREACTIVE

## 2020-01-31 ENCOUNTER — Telehealth (HOSPITAL_COMMUNITY): Payer: Self-pay | Admitting: Emergency Medicine

## 2020-01-31 MED ORDER — METRONIDAZOLE 500 MG PO TABS
500.0000 mg | ORAL_TABLET | Freq: Two times a day (BID) | ORAL | 0 refills | Status: DC
Start: 2020-01-31 — End: 2020-03-12

## 2020-03-12 ENCOUNTER — Encounter: Payer: Self-pay | Admitting: Emergency Medicine

## 2020-03-12 ENCOUNTER — Ambulatory Visit
Admission: EM | Admit: 2020-03-12 | Discharge: 2020-03-12 | Disposition: A | Discharge status: Home / Self Care | Payer: Medicaid - Out of State | Attending: Family Medicine | Admitting: Family Medicine

## 2020-03-12 ENCOUNTER — Other Ambulatory Visit: Payer: Self-pay

## 2020-03-12 DIAGNOSIS — Z711 Person with feared health complaint in whom no diagnosis is made: Secondary | ICD-10-CM | POA: Insufficient documentation

## 2020-03-12 DIAGNOSIS — N760 Acute vaginitis: Secondary | ICD-10-CM | POA: Insufficient documentation

## 2020-03-12 LAB — POCT URINALYSIS DIP (MANUAL ENTRY)
Bilirubin, UA: NEGATIVE
Glucose, UA: NEGATIVE mg/dL
Ketones, POC UA: NEGATIVE mg/dL
Nitrite, UA: NEGATIVE
Protein Ur, POC: NEGATIVE mg/dL
Spec Grav, UA: >=1.03 — AB (ref 1.010–1.025)
Urobilinogen, UA: 0.2 E.U./dL
pH, UA: 5.5 (ref 5.0–8.0)

## 2020-03-12 LAB — POCT URINE PREGNANCY: Preg Test, Ur: NEGATIVE

## 2020-03-12 MED ORDER — FLUCONAZOLE 200 MG PO TABS: ORAL_TABLET | ORAL | 0 refills | Status: DC

## 2020-03-12 MED ORDER — METRONIDAZOLE 500 MG PO TABS
500.0000 mg | ORAL_TABLET | Freq: Two times a day (BID) | ORAL | 0 refills | Status: DC
Start: 2020-03-12 — End: 2020-05-29

## 2020-03-12 NOTE — ED Triage Notes (Signed)
Vaginal discharge, vaginal odor started after abortion in November.  2 weeks after this abortion is when symptoms started.  Prior to the abortion, patient was seen in [facility] for BV and yeast.  Patient thinks this is what is going on now.  The last time, she did not receive a follow up pill for the yeast and thinks this is partly responsible for current symptoms

## 2020-03-12 NOTE — ED Provider Notes (Signed)
[facility]    CSN: [id number] Arrival date & time: 03/12/20  1752      History   Chief Complaint Chief Complaint  Patient presents with  . SEXUALLY TRANSMITTED DISEASE    HPI Shelly Mccann is a 24 y.o. female.   HPI  Patient presents today for vaginitis-like symptoms or vaginal discharge vaginal odor ongoing x4 weeks. Patient was seen and treated for bacterial vaginosis and yeast In October.  Endorses symptoms only resolve short period of time.  Patient status post elective abortion and reports that symptoms worsen shortly afterwards.  Unaware of any exposures to any specific STDs..  History reviewed. No pertinent past medical history.  Patient Active Problem List   Diagnosis Date Noted  . S/P tonsillectomy 10/03/2013    Past Surgical History:  Procedure Laterality Date  . TONSILLECTOMY N/A 10/03/2013   Procedure: TONSILLECTOMY;  Surgeon: Serena Colonel, MD;  Location: [facility];  Service: ENT;  Laterality: N/A;    OB History   No obstetric history on file.      Home Medications    Prior to Admission medications   Medication Sig Start Date End Date Taking? Authorizing Provider  fluconazole (DIFLUCAN) 200 MG tablet Take one dose by mouth, wait 72 hours, and then take second dose by mouth 11/07/19   Wurst, [place], PA-C  metroNIDAZOLE (FLAGYL) 500 MG tablet Take 1 tablet (500 mg total) by mouth 2 (two) times daily. 01/31/20   Lamptey, Britta Mccreedy, MD  promethazine (PHENERGAN) 25 MG suppository Place 1 suppository (25 mg total) rectally every 6 (six) hours as needed for nausea or vomiting. Patient not taking: Reported on 06/16/2019 10/03/13 11/07/19  Serena Colonel, MD    Family History Family History  Problem Relation Age of Onset  . Alcohol abuse Paternal Grandfather   . Hypertension Paternal Grandfather     Social History Social History   Tobacco Use  . Smoking status: Never Smoker  . Smokeless tobacco: Never Used  Substance Use Topics   . Alcohol use: No  . Drug use: Not Currently    Types: Marijuana     Allergies   Patient has no known allergies.   Review of Systems Review of Systems Pertinent negatives listed in HPI  Physical Exam Triage Vital Signs ED Triage Vitals  Enc Vitals Group     BP 03/12/20 1842 113/76     Pulse Rate 03/12/20 1842 (!) 58     Resp 03/12/20 1842 18     Temp 03/12/20 1842 98 F (36.7 C)     Temp Source 03/12/20 1842 Oral     SpO2 03/12/20 1842 99 %     Weight --      Height --      Head Circumference --      Peak Flow --      Pain Score 03/12/20 1840 0     Pain Loc --      Pain Edu? --      Excl. in GC? --    No data found.  Updated Vital Signs BP 113/76 (BP Location: Right Arm)   Pulse (!) 58   Temp 98 F (36.7 C) (Oral)   Resp 18   SpO2 99%   Visual Acuity Right Eye Distance:   Left Eye Distance:   Bilateral Distance:    Right Eye Near:   Left Eye Near:    Bilateral Near:     Physical Exam General appearance: alert, well developed, well nourished,  cooperative and in no distress Head: Normocephalic, without obvious abnormality, atraumatic Respiratory: Respirations even and unlabored, normal respiratory rate Heart: Rate and Rhythm normal.  Extremities: No gross deformities Skin: Skin color, texture, turgor normal. No rashes seen  Psych: Appropriate mood and affect. Vaginal cytology self collected  UC Treatments / Results  Labs (all labs ordered are listed, but only abnormal results are displayed) Labs Reviewed  HIV ANTIBODY (ROUTINE TESTING W REFLEX)  RPR  POCT URINE PREGNANCY  POCT URINALYSIS DIP (MANUAL ENTRY)  CERVICOVAGINAL ANCILLARY ONLY    EKG   Radiology No results found.  Procedures Procedures (including critical care time)  Medications Ordered in UC Medications - No data to display  Initial Impression / Assessment and Plan / UC Course  I have reviewed the triage vital signs and the nursing notes.  Pertinent labs & imaging  results that were available during my care of the patient were reviewed by me and considered in my medical decision making (see chart for details).    UA cloudy with trace leuks., no treatment warranted due to no urinary symptoms, will culture urine to rule out infection source of vaginal irritation symptoms. Urine  HCG negative.Vaginal specimen results will be available 3-5 days. I am prescribing flagyl 500 mg BID for a total of 7 days and Diflucan 150 mg take one tablet and you may repeat diflucan in 3 days if symptoms remain present.  Final Clinical Impressions(s) / UC Diagnoses   Final diagnoses:  Concern about STD in female without diagnosis  Vaginitis and vulvovaginitis     Discharge Instructions     STD test results should be available within the next 4-5 business days.  Your results will update on MyChart.    ED Prescriptions    Medication Sig Dispense Auth. Provider   metroNIDAZOLE (FLAGYL) 500 MG tablet Take 1 tablet (500 mg total) by mouth 2 (two) times daily. 14 tablet Bing Neighbors, FNP   fluconazole (DIFLUCAN) 200 MG tablet Take one dose by mouth, wait 72 hours, and then take second dose by mouth 2 tablet Bing Neighbors, FNP     PDMP not reviewed this encounter.   Bing Neighbors, FNP 03/26/20 [phone]

## 2020-03-12 NOTE — Discharge Instructions (Addendum)
STD test results should be available within the next 4-5 business days.  Your results will update on MyChart.

## 2020-03-15 LAB — CERVICOVAGINAL ANCILLARY ONLY
Bacterial Vaginitis (gardnerella): POSITIVE — AB
Candida Glabrata: NEGATIVE
Candida Vaginitis: NEGATIVE
Chlamydia: NEGATIVE
Comment: NEGATIVE
Comment: NEGATIVE
Comment: NEGATIVE
Comment: NEGATIVE
Comment: NEGATIVE
Comment: NORMAL
Neisseria Gonorrhea: NEGATIVE
Trichomonas: NEGATIVE

## 2020-03-16 ENCOUNTER — Telehealth (HOSPITAL_COMMUNITY): Payer: Self-pay | Admitting: Emergency Medicine

## 2020-03-16 LAB — URINE CULTURE: Culture: >=100000 — AB

## 2020-03-16 MED ORDER — SULFAMETHOXAZOLE-TRIMETHOPRIM 800-160 MG PO TABS: 1.0000 | ORAL_TABLET | Freq: Two times a day (BID) | ORAL | 0 refills | Status: AC

## 2020-05-29 ENCOUNTER — Encounter: Payer: Self-pay | Admitting: Emergency Medicine

## 2020-05-29 ENCOUNTER — Ambulatory Visit
Admission: EM | Admit: 2020-05-29 | Discharge: 2020-05-29 | Disposition: A | Discharge status: Home / Self Care | Payer: Self-pay | Attending: Emergency Medicine | Admitting: Emergency Medicine

## 2020-05-29 ENCOUNTER — Other Ambulatory Visit: Payer: Self-pay

## 2020-05-29 DIAGNOSIS — N898 Other specified noninflammatory disorders of vagina: Secondary | ICD-10-CM | POA: Insufficient documentation

## 2020-05-29 DIAGNOSIS — B3731 Acute candidiasis of vulva and vagina: Secondary | ICD-10-CM

## 2020-05-29 DIAGNOSIS — N760 Acute vaginitis: Secondary | ICD-10-CM | POA: Insufficient documentation

## 2020-05-29 DIAGNOSIS — B373 Candidiasis of vulva and vagina: Secondary | ICD-10-CM | POA: Insufficient documentation

## 2020-05-29 DIAGNOSIS — Z202 Contact with and (suspected) exposure to infections with a predominantly sexual mode of transmission: Secondary | ICD-10-CM | POA: Insufficient documentation

## 2020-05-29 DIAGNOSIS — B9689 Other specified bacterial agents as the cause of diseases classified elsewhere: Secondary | ICD-10-CM | POA: Insufficient documentation

## 2020-05-29 LAB — POCT URINALYSIS DIP (MANUAL ENTRY)
Bilirubin, UA: NEGATIVE
Blood, UA: NEGATIVE
Glucose, UA: NEGATIVE mg/dL
Ketones, POC UA: NEGATIVE mg/dL
Nitrite, UA: NEGATIVE
Protein Ur, POC: NEGATIVE mg/dL
Spec Grav, UA: 1.025 (ref 1.010–1.025)
Urobilinogen, UA: 0.2 E.U./dL
pH, UA: 7.5 (ref 5.0–8.0)

## 2020-05-29 MED ORDER — METRONIDAZOLE 500 MG PO TABS
500.0000 mg | ORAL_TABLET | Freq: Two times a day (BID) | ORAL | 0 refills | Status: DC
Start: 2020-05-29 — End: 2020-07-14

## 2020-05-29 MED ORDER — FLUCONAZOLE 150 MG PO TABS: 150.0000 mg | ORAL_TABLET | Freq: Once | ORAL | 0 refills | Status: AC

## 2020-05-29 NOTE — ED Triage Notes (Signed)
Vaginal discharge x 1 week ago.  Pt states she thinks she may have yeast and bv infection but would like to be tested for std's.

## 2020-05-29 NOTE — Discharge Instructions (Signed)
Vaginal self-swab obtained.  We will follow up with you regarding abnormal results Prescribed metronidazole twice daily for 7 days (do not take while consuming alcohol and/or if breastfeeding) Prescribed diflucan 150 mg once daily and then second dose 72 hours later Take medications as prescribed and to completion If tests results are positive, please abstain from sexual activity until you and your partner(s) have been treated Follow up with PCP or [facility] if symptoms persists Return here or go to ER if you have any new or worsening symptoms fever, chills, nausea, vomiting, abdominal or pelvic pain, painful intercourse, vaginal discharge, vaginal bleeding, persistent symptoms despite treatment, etc..Marland Kitchen

## 2020-05-29 NOTE — ED Provider Notes (Addendum)
[facility]   [id number] 05/29/20 Arrival Time: 1451   [id number] DISCHARGE  SUBJECTIVE:  Shelly Mccann is a 25 y.o. female who presented to the urgent care with a complaint of vaginal discharge for the past 1 week.  She denies a precipitating event, recent sexual encounter or recent antibiotic use.  Patient is sexually active with one female partner.  Describes discharge as thin white.  Has not tried any OTC medication.  Denies alleviating or aggravating factors.  Reports similar symptoms in the past and was diagnosed with BV.  She denies fever, chills, nausea, vomiting, abdominal or pelvic pain, urinary symptoms, vaginal itching, vaginal odor, vaginal bleeding, dyspareunia, vaginal rashes or lesions.   Patient's last menstrual period was 05/22/2020. Current birth control method: Compliant with BC:  ROS: As per HPI.  All other pertinent ROS negative.     History reviewed. No pertinent past medical history. Past Surgical History:  Procedure Laterality Date  . TONSILLECTOMY N/A 10/03/2013   Procedure: TONSILLECTOMY;  Surgeon: Serena Colonel, MD;  Location: [facility];  Service: ENT;  Laterality: N/A;   No Known Allergies No current facility-administered medications on file prior to encounter.   Current Outpatient Medications on File Prior to Encounter  Medication Sig Dispense Refill  . [DISCONTINUED] promethazine (PHENERGAN) 25 MG suppository Place 1 suppository (25 mg total) rectally every 6 (six) hours as needed for nausea or vomiting. (Patient not taking: Reported on 06/16/2019) 12 suppository 1    Social History   Socioeconomic History  . Marital status: Single    Spouse name: Not on file  . Number of children: Not on file  . Years of education: Not on file  . Highest education level: Not on file  Occupational History  . Not on file  Tobacco Use  . Smoking status: Never Smoker  . Smokeless tobacco: Never Used  Substance and Sexual Activity  .  Alcohol use: No  . Drug use: Yes    Types: Marijuana    Comment: occ  . Sexual activity: Never    Birth control/protection: None    Comment: pt has norplant in arm for birth control  Other Topics Concern  . Not on file  Social History Narrative  . Not on file   Social Determinants of Health   Financial Resource Strain: Not on file  Food Insecurity: Not on file  Transportation Needs: Not on file  Physical Activity: Not on file  Stress: Not on file  Social Connections: Not on file  Intimate Partner Violence: Not on file   Family History  Problem Relation Age of Onset  . Alcohol abuse Paternal Grandfather   . Hypertension Paternal Grandfather     OBJECTIVE:  Vitals:   05/29/20 1501  BP: 111/71  Pulse: 76  Resp: 18  Temp: 98.4 F (36.9 C)  TempSrc: Oral  SpO2: 95%     General appearance: Alert, NAD, appears stated age Head: NCAT Throat: lips, mucosa, and tongue normal; teeth and gums normal Lungs: CTA bilaterally without adventitious breath sounds Heart: regular rate and rhythm.  Radial pulses 2+ symmetrical bilaterally Back: no CVA tenderness Abdomen: soft, non-tender; bowel sounds normal; no masses or organomegaly; no guarding or rebound tenderness GU: Deferred; cervical self swab obtained Skin: warm and dry Psychological:  Alert and cooperative. Normal mood and affect.  LABS:  Results for orders placed or performed during the hospital encounter of 05/29/20  POCT urinalysis dipstick  Result Value Ref Range   Color, UA yellow  yellow   Clarity, UA hazy (A) clear   Glucose, UA negative negative mg/dL   Bilirubin, UA negative negative   Ketones, POC UA negative negative mg/dL   Spec Grav, UA 1.937 9.024 - 1.025   Blood, UA negative negative   pH, UA 7.5 5.0 - 8.0   Protein Ur, POC negative negative mg/dL   Urobilinogen, UA 0.2 0.2 or 1.0 E.U./dL   Nitrite, UA Negative Negative   Leukocytes, UA Trace (A) Negative    Labs Reviewed  POCT URINALYSIS DIP  (MANUAL ENTRY) - Abnormal; Notable for the following components:      Result Value   Clarity, UA hazy (*)    Leukocytes, UA Trace (*)    All other components within normal limits  URINE CULTURE  CERVICOVAGINAL ANCILLARY ONLY    ASSESSMENT & PLAN:  1. STD exposure   2. BV (bacterial vaginosis)   3. Vaginal yeast infection   4. Vaginal discharge     Meds ordered this encounter  Medications  . metroNIDAZOLE (FLAGYL) 500 MG tablet    Sig: Take 1 tablet (500 mg total) by mouth 2 (two) times daily.    Dispense:  14 tablet    Refill:  0  . fluconazole (DIFLUCAN) 150 MG tablet    Sig: Take 1 tablet (150 mg total) by mouth once for 1 dose. May take the second dose 72 hours after the first if symptom does not resolve    Dispense:  2 tablet    Refill:  0    Pending: Labs Reviewed  POCT URINALYSIS DIP (MANUAL ENTRY) - Abnormal; Notable for the following components:      Result Value   Clarity, UA hazy (*)    Leukocytes, UA Trace (*)    All other components within normal limits  URINE CULTURE  CERVICOVAGINAL ANCILLARY ONLY   Discharge instructions  Vaginal self-swab obtained.  We will follow up with you regarding abnormal results Prescribed metronidazole twice daily for 7 days (do not take while consuming alcohol and/or if breastfeeding) Prescribed diflucan 150 mg once daily and then second dose 72 hours later Take medications as prescribed and to completion If tests results are positive, please abstain from sexual activity until you and your partner(s) have been treated Follow up with PCP or [facility] if symptoms persists Return here or go to ER if you have any new or worsening symptoms fever, chills, nausea, vomiting, abdominal or pelvic pain, painful intercourse, vaginal discharge, vaginal bleeding, persistent symptoms despite treatment, etc...  Reviewed expectations re: course of current medical issues. Questions answered. Outlined signs and symptoms indicating need  for more acute intervention. Patient verbalized understanding. After Visit Summary given.       Durward Parcel, FNP 05/29/20 1518    Durward Parcel, FNP 05/29/20 1524    Durward Parcel, FNP 05/29/20 1531    Durward Parcel, FNP 05/29/20 [phone]

## 2020-05-30 LAB — CERVICOVAGINAL ANCILLARY ONLY
Bacterial Vaginitis (gardnerella): NEGATIVE
Candida Glabrata: NEGATIVE
Candida Vaginitis: NEGATIVE
Chlamydia: NEGATIVE
Comment: NEGATIVE
Comment: NEGATIVE
Comment: NEGATIVE
Comment: NEGATIVE
Comment: NEGATIVE
Comment: NORMAL
Neisseria Gonorrhea: NEGATIVE
Trichomonas: NEGATIVE

## 2020-05-30 LAB — HIV ANTIBODY (ROUTINE TESTING W REFLEX): HIV Screen 4th Generation wRfx: NONREACTIVE

## 2020-05-30 LAB — RPR: RPR Ser Ql: NONREACTIVE

## 2020-06-01 ENCOUNTER — Telehealth (HOSPITAL_COMMUNITY): Payer: Self-pay | Admitting: Emergency Medicine

## 2020-06-01 LAB — URINE CULTURE: Culture: >=100000 — AB

## 2020-06-01 MED ORDER — SULFAMETHOXAZOLE-TRIMETHOPRIM 800-160 MG PO TABS
1.0000 | ORAL_TABLET | Freq: Two times a day (BID) | ORAL | 0 refills | Status: AC
Start: 2020-06-01 — End: 2020-06-04

## 2020-07-14 ENCOUNTER — Other Ambulatory Visit: Payer: Self-pay

## 2020-07-14 ENCOUNTER — Ambulatory Visit
Admission: EM | Admit: 2020-07-14 | Discharge: 2020-07-14 | Disposition: A | Discharge status: Home / Self Care | Payer: Self-pay | Attending: Emergency Medicine | Admitting: Emergency Medicine

## 2020-07-14 ENCOUNTER — Encounter: Payer: Self-pay | Admitting: Emergency Medicine

## 2020-07-14 DIAGNOSIS — Z711 Person with feared health complaint in whom no diagnosis is made: Secondary | ICD-10-CM | POA: Insufficient documentation

## 2020-07-14 MED ORDER — FLUCONAZOLE 150 MG PO TABS: 150.0000 mg | ORAL_TABLET | Freq: Once | ORAL | 0 refills | Status: AC

## 2020-07-14 MED ORDER — METRONIDAZOLE 500 MG PO TABS
500.0000 mg | ORAL_TABLET | Freq: Two times a day (BID) | ORAL | 0 refills | Status: DC
Start: 2020-07-14 — End: 2020-08-13

## 2020-07-14 NOTE — Discharge Instructions (Addendum)
Vaginal self-swab obtained.  We will follow up with you regarding abnormal results Prescribed metronidazole 500 mg twice daily for 7 days (do not take while consuming alcohol and/or if breastfeeding) Prescribed diflucan 200 mg once daily and then second dose 72 hours later Take medications as prescribed and to completion If tests results are positive, please abstain from sexual activity until you and your partner(s) have been treated Follow up with PCP or [facility] if symptoms persists Return here or go to ER if you have any new or worsening symptoms fever, chills, nausea, vomiting, abdominal or pelvic pain, painful intercourse, vaginal discharge, vaginal bleeding, persistent symptoms despite treatment, etc... 

## 2020-07-14 NOTE — ED Triage Notes (Signed)
Vaginal discharge, odor.  Pt has hx of bv

## 2020-07-14 NOTE — ED Provider Notes (Addendum)
[facility]   [id number] 07/14/20 Arrival Time: 1324   [id number] DISCHARGE  SUBJECTIVE:  Shelly Mccann is a 25 y.o. female with history of BV, UTI presented to the urgent care with a complaint of vaginal discharge and odor for the past few days.  Denies any precipitating event.  Patient is sexually active with 1 female partner.  Describes discharge as thin and white.  She has tried OTC medications with/ without relief.  Denies of any aggravating factor.  She reports similar symptoms in the past and was diagnosed with BV, UTI and treated accordingly.  She denies fever, chills, nausea, vomiting, abdominal or pelvic pain, urinary symptoms, vaginal itching, vaginal odor, vaginal bleeding, dyspareunia, vaginal rashes or lesions.   Patient's last menstrual period was 07/02/2020. Current birth control method: Compliant with BC:  ROS: As per HPI.  All other pertinent ROS negative.     History reviewed. No pertinent past medical history. Past Surgical History:  Procedure Laterality Date  . TONSILLECTOMY N/A 10/03/2013   Procedure: TONSILLECTOMY;  Surgeon: Serena Colonel, MD;  Location: [facility];  Service: ENT;  Laterality: N/A;   No Known Allergies No current facility-administered medications on file prior to encounter.   Current Outpatient Medications on File Prior to Encounter  Medication Sig Dispense Refill  . [DISCONTINUED] promethazine (PHENERGAN) 25 MG suppository Place 1 suppository (25 mg total) rectally every 6 (six) hours as needed for nausea or vomiting. (Patient not taking: Reported on 06/16/2019) 12 suppository 1    Social History   Socioeconomic History  . Marital status: Single    Spouse name: Not on file  . Number of children: Not on file  . Years of education: Not on file  . Highest education level: Not on file  Occupational History  . Not on file  Tobacco Use  . Smoking status: Never Smoker  . Smokeless tobacco: Never Used  Substance and  Sexual Activity  . Alcohol use: No  . Drug use: Yes    Types: Marijuana    Comment: occ  . Sexual activity: Never    Birth control/protection: None    Comment: pt has norplant in arm for birth control  Other Topics Concern  . Not on file  Social History Narrative  . Not on file   Social Determinants of Health   Financial Resource Strain: Not on file  Food Insecurity: Not on file  Transportation Needs: Not on file  Physical Activity: Not on file  Stress: Not on file  Social Connections: Not on file  Intimate Partner Violence: Not on file   Family History  Problem Relation Age of Onset  . Alcohol abuse Paternal Grandfather   . Hypertension Paternal Grandfather     OBJECTIVE:  Vitals:   07/14/20 1335  BP: 137/83  Pulse: (!) 59  Resp: 19  Temp: 98.4 F (36.9 C)  TempSrc: Oral  SpO2: 98%     General appearance: Alert, NAD, appears stated age Head: NCAT Throat: lips, mucosa, and tongue normal; teeth and gums normal Lungs: CTA bilaterally without adventitious breath sounds Heart: regular rate and rhythm.  Radial pulses 2+ symmetrical bilaterally Back: no CVA tenderness Abdomen: soft, non-tender; bowel sounds normal; no masses or organomegaly; no guarding or rebound tenderness GU: Deferred, cervical self swab obtained Skin: warm and dry Psychological:  Alert and cooperative. Normal mood and affect.  LABS:  Results for orders placed or performed during the hospital encounter of 05/29/20  Urine Culture   Specimen: Urine,  Clean Catch  Result Value Ref Range   Specimen Description      URINE, CLEAN CATCH Performed at [facility] [address], [place], [place] [address]    Special Requests      NONE Performed at [facility], [address] [place], [place] [address]    Culture >=100,000 COLONIES/mL KLEBSIELLA PNEUMONIAE (A)    Report Status 06/01/2020 FINAL    Organism ID, Bacteria KLEBSIELLA PNEUMONIAE (A)       Susceptibility   Klebsiella pneumoniae -  MIC*    AMPICILLIN >=32 RESISTANT Resistant     CEFAZOLIN <=4 SENSITIVE Sensitive     CEFEPIME <=0.12 SENSITIVE Sensitive     CEFTRIAXONE <=0.25 SENSITIVE Sensitive     CIPROFLOXACIN <=0.25 SENSITIVE Sensitive     GENTAMICIN <=1 SENSITIVE Sensitive     IMIPENEM <=0.25 SENSITIVE Sensitive     NITROFURANTOIN 128 RESISTANT Resistant     TRIMETH/SULFA <=20 SENSITIVE Sensitive     AMPICILLIN/SULBACTAM 4 SENSITIVE Sensitive     PIP/TAZO <=4 SENSITIVE Sensitive     * >=100,000 COLONIES/mL KLEBSIELLA PNEUMONIAE  RPR  Result Value Ref Range   RPR Ser Ql Non Reactive Non Reactive  HIV Antibody (routine testing w rflx)  Result Value Ref Range   HIV Screen 4th Generation wRfx Non Reactive Non Reactive  POCT urinalysis dipstick  Result Value Ref Range   Color, UA yellow yellow   Clarity, UA hazy (A) clear   Glucose, UA negative negative mg/dL   Bilirubin, UA negative negative   Ketones, POC UA negative negative mg/dL   Spec Grav, UA 7.858 8.502 - 1.025   Blood, UA negative negative   pH, UA 7.5 5.0 - 8.0   Protein Ur, POC negative negative mg/dL   Urobilinogen, UA 0.2 0.2 or 1.0 E.U./dL   Nitrite, UA Negative Negative   Leukocytes, UA Trace (A) Negative  Cervicovaginal ancillary only  Result Value Ref Range   Neisseria Gonorrhea Negative    Chlamydia Negative    Trichomonas Negative    Bacterial Vaginitis (gardnerella) Negative    Candida Vaginitis Negative    Candida Glabrata Negative    Comment Normal Reference Range Candida Species - Negative    Comment Normal Reference Range Candida Galbrata - Negative    Comment Normal Reference Range Trichomonas - Negative    Comment Normal Reference Ranger Chlamydia - Negative    Comment      Normal Reference Range Neisseria Gonorrhea - Negative   Comment      Normal Reference Range Bacterial Vaginosis - Negative    Labs Reviewed  CERVICOVAGINAL ANCILLARY ONLY    ASSESSMENT & PLAN:  1. Concern about STD in female without diagnosis      Meds ordered this encounter  Medications  . metroNIDAZOLE (FLAGYL) 500 MG tablet    Sig: Take 1 tablet (500 mg total) by mouth 2 (two) times daily.    Dispense:  14 tablet    Refill:  0  . fluconazole (DIFLUCAN) 150 MG tablet    Sig: Take 1 tablet (150 mg total) by mouth once for 1 dose. May take the second dose 72 hours after the first if symptom does not resolve    Dispense:  2 tablet    Refill:  0    Pending: Labs Reviewed  CERVICOVAGINAL ANCILLARY ONLY   Patient is stable at discharge.  Will treat her for possible BV with metronidazole and vaginal  yeast with Diflucan.  Will await cervical ancillary test result.  Discharge instructions   Vaginal self-swab obtained.  We will follow up with you regarding abnormal results Prescribed metronidazole 500 mg twice daily for 7 days (do not take while consuming alcohol and/or if breastfeeding) Prescribed diflucan 200 mg once daily and then second dose 72 hours later Take medications as prescribed and to completion If tests results are positive, please abstain from sexual activity until you and your partner(s) have been treated Follow up with PCP or [facility] if symptoms persists Return here or go to ER if you have any new or worsening symptoms fever, chills, nausea, vomiting, abdominal or pelvic pain, painful intercourse, vaginal discharge, vaginal bleeding, persistent symptoms despite treatment, etc...  Reviewed expectations re: course of current medical issues. Questions answered. Outlined signs and symptoms indicating need for more acute intervention. Patient verbalized understanding. After Visit Summary given.       Durward Parcel, FNP 07/14/20 1358    Durward Parcel, FNP 07/14/20 1359

## 2020-07-16 LAB — CERVICOVAGINAL ANCILLARY ONLY
Bacterial Vaginitis (gardnerella): POSITIVE — AB
Candida Glabrata: NEGATIVE
Candida Vaginitis: POSITIVE — AB
Chlamydia: NEGATIVE
Comment: NEGATIVE
Comment: NEGATIVE
Comment: NEGATIVE
Comment: NEGATIVE
Comment: NEGATIVE
Comment: NORMAL
Neisseria Gonorrhea: NEGATIVE
Trichomonas: NEGATIVE

## 2020-08-13 ENCOUNTER — Other Ambulatory Visit: Payer: Self-pay

## 2020-08-13 ENCOUNTER — Encounter: Payer: Self-pay | Admitting: Women's Health

## 2020-08-13 ENCOUNTER — Other Ambulatory Visit (HOSPITAL_COMMUNITY)
Admission: RE | Admit: 2020-08-13 | Discharge: 2020-08-13 | Disposition: A | Discharge status: Home / Self Care | Payer: Medicaid Other | Source: Ambulatory Visit | Attending: Obstetrics & Gynecology | Admitting: Obstetrics & Gynecology

## 2020-08-13 ENCOUNTER — Ambulatory Visit (INDEPENDENT_AMBULATORY_CARE_PROVIDER_SITE_OTHER): Payer: Medicaid Other | Admitting: Women's Health

## 2020-08-13 VITALS — BP 137/90 | HR 73 | Ht 69.0 in | Wt 205.0 lb

## 2020-08-13 DIAGNOSIS — Z3009 Encounter for other general counseling and advice on contraception: Secondary | ICD-10-CM

## 2020-08-13 DIAGNOSIS — Z01419 Encounter for gynecological examination (general) (routine) without abnormal findings: Secondary | ICD-10-CM | POA: Diagnosis not present

## 2020-08-13 DIAGNOSIS — Z30018 Encounter for initial prescription of other contraceptives: Secondary | ICD-10-CM

## 2020-08-13 DIAGNOSIS — Z0000 Encounter for general adult medical examination without abnormal findings: Secondary | ICD-10-CM

## 2020-08-13 DIAGNOSIS — Z113 Encounter for screening for infections with a predominantly sexual mode of transmission: Secondary | ICD-10-CM

## 2020-08-13 MED ORDER — PHEXXI 1.8-1-0.4 % VA GEL: 1.0000 | Freq: Once | VAGINAL | 11 refills | Status: AC

## 2020-08-13 NOTE — Patient Instructions (Addendum)
Recurrent Vaginitis Therapies  Both options are to be done after sexual intercourse, your period ends, and when you think you may have bacterial vaginosis (BV) or a yeast infection.  If symptoms persist you will need to schedule an appointment to be seen  1) Soak in tub of waist- high warm water with 1/2 cup of baking soda for at least 20 mins.  2) Soak 3 tampons in 1 tablespoon of fractionated (liquid form) coconut oil with 10 drops of Melaleuca (Tea Tree) essential oil, insert 1 saturated tampon vaginally at bedtime x 3 days.    You can purchase Melaleuca/Tea Tree oil online at [employer], with a [employer] or [employer] representative, or locally at:  [employer] [address] [place], [place] [address] [phone]  [facility] [address] [place], [place] [address] [phone]  You may also want to consider making the changes below:  . Soap: Unscented Dove (white box light green writing)  . Wash cloth: use a separate white washcloth for your genital area . Laundry detergent: Dreft or unscented Arm n' Hammer  . Underwear: White 100% cotton panties (NOT just cotton crouch) . Sanitary pads/tampons: Unscented only-If it doesn't SAY unscented it can have a scent/perfume    . NO PERFUMES OR LOTIONS OR POTIONS in the genital area (may use regular [place]) . Condoms: hypoallergenic only, non-dyed (no color) . Toilet paper: White unscented only

## 2020-08-13 NOTE — Progress Notes (Signed)
WELL-WOMAN EXAMINATION Patient name: Shelly Mccann MRN [id number]  Date of birth: [birth year] Chief Complaint:   Gynecologic Exam  History of Present Illness:   Shelly Mccann is a 25 y.o. [id number] African American female being seen today for a routine FP [facility] well-woman exam.  Current complaints: recurrent BV and yeast. Uses PlanB frequently, but doesn't want to be on birth control. Discussed all options, including Phexxi- would like to try this.   Depression screen [facility] 2/9 08/13/2020  Decreased Interest 0  Down, Depressed, Hopeless 0  PHQ - 2 Score 0  Altered sleeping 0  Tired, decreased energy 0  Change in appetite 1  Feeling bad or failure about yourself  1  Trouble concentrating 0  Moving slowly or fidgety/restless 0  Suicidal thoughts 0  PHQ-9 Score 2     PCP: none      Patient's last menstrual period was 08/01/2020. The current method of family planning is PlanB.  Last pap never. Results were: N/A. H/O abnormal pap: no Last mammogram: never. Results were: N/A. Family h/o breast cancer: yes MGGM Last colonoscopy: never. Results were: N/A. Family h/o colorectal cancer: no Review of Systems:   Pertinent items are noted in HPI Denies any headaches, blurred vision, fatigue, shortness of breath, chest pain, abdominal pain, abnormal vaginal discharge/itching/odor/irritation, problems with periods, bowel movements, urination, or intercourse unless otherwise stated above. Pertinent History Reviewed:  Reviewed past medical,surgical, social and family history.  Reviewed problem list, medications and allergies. Physical Assessment:   Vitals:   08/13/20 1604  BP: 137/90  Pulse: 73  Weight: 205 lb (93 kg)  Height: 5\' 9"  (1.753 m)  Body mass index is 30.27 kg/m.        Physical Examination:   General appearance - well appearing, and in no distress  Mental status - alert, oriented to person, place, and time  Psych:  She has a normal mood and affect  Skin - warm and dry,  normal color, no suspicious lesions noted  Chest - effort normal, all lung fields clear to auscultation bilaterally  Heart - normal rate and regular rhythm  Neck:  midline trachea, no thyromegaly or nodules  Breasts - breasts appear normal, no suspicious masses, no skin or nipple changes or  axillary nodes  Abdomen - soft, nontender, nondistended, no masses or organomegaly  Pelvic - VULVA: normal appearing vulva with no masses, tenderness or lesions  VAGINA: normal appearing vagina with normal color and discharge, no lesions  CERVIX: normal appearing cervix without discharge or lesions, no CMT  Thin prep pap is done w/ HR HPV cotesting  UTERUS: uterus is felt to be normal size, shape, consistency and nontender   ADNEXA: No adnexal masses or tenderness noted.  Extremities:  No swelling or varicosities noted  Chaperone:    No results found for this or any previous visit (from the past 24 hour(s)).  Assessment & Plan:  1) FP [facility] Well-Woman Exam  2) Contraception management> rx Phexxi, 1 sample given and instructed on use  3) STD screen  4) Recurrent vaginitis> tips/tricks given  Labs/procedures today: pap, STD screen  Mammogram: @ 25yo, or sooner if problems Colonoscopy: @ 25yo, or sooner if problems  Orders Placed This Encounter  Procedures  . HIV Antibody (routine testing w rflx)  . RPR    Meds:  Meds ordered this encounter  Medications  . Lactic Ac-Citric Ac-Pot Bitart (PHEXXI) 1.8-1-0.4 % GEL    Sig: Place 1 Applicatorful vaginally once  for 1 dose. Up to 1 hour before sex    Dispense:  180 g    Refill:  11    Order Specific Question:   Supervising Provider    Answer:   Lazaro Arms [[id number]]    Follow-up: Return in about 1 year (around 08/13/2021) for Physical.  Cheral Marker CNM, WHNP-BC 08/13/2020 4:51 PM

## 2020-08-14 LAB — RPR: RPR Ser Ql: NONREACTIVE

## 2020-08-14 LAB — HIV ANTIBODY (ROUTINE TESTING W REFLEX): HIV Screen 4th Generation wRfx: NONREACTIVE

## 2020-08-15 LAB — CYTOLOGY - PAP
Chlamydia: NEGATIVE
Comment: NEGATIVE
Comment: NORMAL
Diagnosis: NEGATIVE
Neisseria Gonorrhea: NEGATIVE

## 2020-08-29 ENCOUNTER — Encounter: Payer: Self-pay | Admitting: Emergency Medicine

## 2020-08-29 ENCOUNTER — Ambulatory Visit
Admission: EM | Admit: 2020-08-29 | Discharge: 2020-08-29 | Disposition: A | Discharge status: Home / Self Care | Payer: Self-pay | Attending: Family Medicine | Admitting: Family Medicine

## 2020-08-29 ENCOUNTER — Other Ambulatory Visit: Payer: Self-pay

## 2020-08-29 DIAGNOSIS — N760 Acute vaginitis: Secondary | ICD-10-CM

## 2020-08-29 MED ORDER — BORIC ACID VAGINAL 600 MG VA SUPP: 1.0000 | Freq: Every day | VAGINAL | 2 refills | Status: DC | PRN

## 2020-08-29 MED ORDER — METRONIDAZOLE 500 MG PO TABS: 500.0000 mg | ORAL_TABLET | Freq: Two times a day (BID) | ORAL | 0 refills | Status: DC

## 2020-08-29 MED ORDER — FLUCONAZOLE 150 MG PO TABS: ORAL_TABLET | ORAL | 0 refills | Status: DC

## 2020-08-29 NOTE — Discharge Instructions (Addendum)
I have sent in Metronidazole for you to take twice a day for 7 days.  I have sent in fluconazole in case of yeast. Take one tablet at the onset of symptoms. If symptoms are still present in 3 days, take the second tablet.   Do not drink alcohol with this medication, it will make you very sick.  May use Boric Acid suppositories daily for 30 days. Then may use 2-3 times per week for maintenance.  Follow up with PCP or GYN as needed.  

## 2020-08-29 NOTE — ED Provider Notes (Signed)
[facility]    CSN: [id number] Arrival date & time: 08/29/20  1259      History   Chief Complaint No chief complaint on file.   HPI Shelly Mccann is a 25 y.o. female.   Reports milk colored vaginal discharge and itching for the last week.  Reports recurrent BV and yeast.  Denies new sexual partners.  Denies concern for other STDs.  Denies abdominal pain, dyspareunia, abnormal bleeding, dysuria, urinary frequency, urgency, other symptoms.  ROS per HPI  The history is provided by the patient.    History reviewed. No pertinent past medical history.  Patient Active Problem List   Diagnosis Date Noted  . S/P tonsillectomy 10/03/2013    Past Surgical History:  Procedure Laterality Date  . TONSILLECTOMY N/A 10/03/2013   Procedure: TONSILLECTOMY;  Surgeon: Serena Colonel, MD;  Location: [facility];  Service: ENT;  Laterality: N/A;    OB History    Gravida  2   Para      Term      Preterm      AB  2   Living        SAB  1   IAB  1   Ectopic      Multiple      Live Births               Home Medications    Prior to Admission medications   Medication Sig Start Date End Date Taking? Authorizing Provider  Boric Acid Vaginal 600 MG SUPP Place 1 capsule vaginally daily as needed. 08/29/20  Yes Moshe Cipro, NP  fluconazole (DIFLUCAN) 150 MG tablet Take one tablet at the onset of symptoms. If symptoms are still present 3 days later, take the second tablet. 08/29/20  Yes Moshe Cipro, NP  metroNIDAZOLE (FLAGYL) 500 MG tablet Take 1 tablet (500 mg total) by mouth 2 (two) times daily. 08/29/20  Yes Moshe Cipro, NP  promethazine (PHENERGAN) 25 MG suppository Place 1 suppository (25 mg total) rectally every 6 (six) hours as needed for nausea or vomiting. Patient not taking: Reported on 06/16/2019 10/03/13 11/07/19  Serena Colonel, MD    Family History Family History  Problem Relation Age of Onset  . Alcohol abuse Paternal  Grandfather   . Hypertension Paternal Grandfather     Social History Social History   Tobacco Use  . Smoking status: Never Smoker  . Smokeless tobacco: Never Used  Vaping Use  . Vaping Use: Never used  Substance Use Topics  . Alcohol use: Yes  . Drug use: Yes    Types: Marijuana    Comment: occ     Allergies   Patient has no known allergies.   Review of Systems Review of Systems   Physical Exam Triage Vital Signs ED Triage Vitals  Enc Vitals Group     BP 08/29/20 1309 113/73     Pulse Rate 08/29/20 1309 70     Resp 08/29/20 1309 18     Temp 08/29/20 1309 98.2 F (36.8 C)     Temp Source 08/29/20 1309 Oral     SpO2 08/29/20 1309 97 %     Weight --      Height --      Head Circumference --      Peak Flow --      Pain Score 08/29/20 1313 0     Pain Loc --      Pain Edu? --  Excl. in GC? --    No data found.  Updated Vital Signs BP 113/73   Pulse 70   Temp 98.2 F (36.8 C) (Oral)   Resp 18   LMP 08/01/2020   SpO2 97%   Visual Acuity Right Eye Distance:   Left Eye Distance:   Bilateral Distance:    Right Eye Near:   Left Eye Near:    Bilateral Near:     Physical Exam Vitals and nursing note reviewed.  Constitutional:      General: She is not in acute distress.    Appearance: Normal appearance. She is well-developed and normal weight. She is not ill-appearing.  HENT:     Head: Normocephalic and atraumatic.     Nose: Nose normal.     Mouth/Throat:     Mouth: Mucous membranes are moist.     Pharynx: Oropharynx is clear.  Eyes:     Extraocular Movements: Extraocular movements intact.     Conjunctiva/sclera: Conjunctivae normal.     Pupils: Pupils are equal, round, and reactive to light.  Cardiovascular:     Rate and Rhythm: Normal rate and regular rhythm.     Heart sounds: Normal heart sounds. No murmur heard.   Pulmonary:     Effort: Pulmonary effort is normal. No respiratory distress.     Breath sounds: Normal breath sounds.   Abdominal:     General: Bowel sounds are normal. There is no distension.     Palpations: Abdomen is soft. There is no mass.     Tenderness: There is no abdominal tenderness. There is no right CVA tenderness, left CVA tenderness, guarding or rebound.     Hernia: No hernia is present.  Genitourinary:    Comments: Deferred  Musculoskeletal:        General: Normal range of motion.     Cervical back: Normal range of motion and neck supple.  Skin:    General: Skin is warm and dry.     Capillary Refill: Capillary refill takes less than 2 seconds.  Neurological:     General: No focal deficit present.     Mental Status: She is alert and oriented to person, place, and time.  Psychiatric:        Mood and Affect: Mood normal.        Behavior: Behavior normal.        Thought Content: Thought content normal.      UC Treatments / Results  Labs (all labs ordered are listed, but only abnormal results are displayed) Labs Reviewed  CERVICOVAGINAL ANCILLARY ONLY    EKG   Radiology No results found.  Procedures Procedures (including critical care time)  Medications Ordered in UC Medications - No data to display  Initial Impression / Assessment and Plan / UC Course  I have reviewed the triage vital signs and the nursing notes.  Pertinent labs & imaging results that were available during my care of the patient were reviewed by me and considered in my medical decision making (see chart for details).    Acute Vaginitis  Prescribed metronidazole 500 mg twice daily x7 days Prescribed fluconazole for yeast Do not drink alcohol with this medication, it will make you very sick. May use Boric Acid suppositories daily for 30 days. Then may use 2-3 times per week for maintenance. Swab will result in 2 to 3 days Do not have unprotected sex until results are back and negative or until treatment is completed, whichever is longer Follow up with  PCP or GYN as needed.   Final Clinical  Impressions(s) / UC Diagnoses   Final diagnoses:  Acute vaginitis     Discharge Instructions     I have sent in Metronidazole for you to take twice a day for 7 days.  I have sent in fluconazole in case of yeast. Take one tablet at the onset of symptoms. If symptoms are still present in 3 days, take the second tablet.   Do not drink alcohol with this medication, it will make you very sick.  May use Boric Acid suppositories daily for 30 days. Then may use 2-3 times per week for maintenance.  Follow up with PCP or GYN as needed.     ED Prescriptions    Medication Sig Dispense Auth. Provider   metroNIDAZOLE (FLAGYL) 500 MG tablet Take 1 tablet (500 mg total) by mouth 2 (two) times daily. 14 tablet Moshe Cipro, NP   fluconazole (DIFLUCAN) 150 MG tablet Take one tablet at the onset of symptoms. If symptoms are still present 3 days later, take the second tablet. 2 tablet Moshe Cipro, NP   Boric Acid Vaginal 600 MG SUPP Place 1 capsule vaginally daily as needed. 30 suppository Moshe Cipro, NP     PDMP not reviewed this encounter.   Moshe Cipro, NP 08/29/20 1344

## 2020-08-29 NOTE — ED Triage Notes (Signed)
States she thinks she may have a yeast infection.  Milky discharge and some mild itching symptoms started a week ago.

## 2020-08-30 LAB — CERVICOVAGINAL ANCILLARY ONLY
Bacterial Vaginitis (gardnerella): POSITIVE — AB
Candida Glabrata: NEGATIVE
Candida Vaginitis: POSITIVE — AB
Chlamydia: NEGATIVE
Comment: NEGATIVE
Comment: NEGATIVE
Comment: NEGATIVE
Comment: NEGATIVE
Comment: NEGATIVE
Comment: NORMAL
Neisseria Gonorrhea: NEGATIVE
Trichomonas: NEGATIVE

## 2020-09-26 ENCOUNTER — Other Ambulatory Visit: Payer: Self-pay

## 2020-09-26 ENCOUNTER — Ambulatory Visit
Admission: EM | Admit: 2020-09-26 | Discharge: 2020-09-26 | Disposition: A | Discharge status: Home / Self Care | Payer: Medicaid Other | Attending: Family Medicine | Admitting: Family Medicine

## 2020-09-26 DIAGNOSIS — N760 Acute vaginitis: Secondary | ICD-10-CM

## 2020-09-26 MED ORDER — METRONIDAZOLE 500 MG PO TABS: 500.0000 mg | ORAL_TABLET | Freq: Two times a day (BID) | ORAL | 0 refills | Status: DC

## 2020-09-26 MED ORDER — FLUCONAZOLE 200 MG PO TABS: 200.0000 mg | ORAL_TABLET | Freq: Once | ORAL | 0 refills | Status: AC

## 2020-09-26 NOTE — Discharge Instructions (Signed)
I have prescribed metronidazole for you to take twice a day for 7 days. Do not drink alcohol while taking this medication.  I have sent in fluconazole in case of yeast. Take one tablet at the onset of symptoms. If symptoms are still present in 3 days, take the second tablet.   Swab results will be back in about 3 days. We will follow up with any abnormal results that require further treatment  Follow up with this office or with primary care if symptoms are persisting.  Follow up in the ER for high fever, trouble swallowing, trouble breathing, other concerning symptoms.

## 2020-09-26 NOTE — ED Triage Notes (Signed)
Pt presents with c/o thick vaginal discharge  yesterday just wants a swab for std

## 2020-09-26 NOTE — ED Provider Notes (Signed)
[facility]   [id number] 09/26/20 Arrival Time: 1727   CC: VAGINAL DISCHARGE  SUBJECTIVE:  Shelly Mccann is a 25 y.o. female who presents with complaints of gradual vaginal discharge that began yesterday. Reports recent sexual activity and antibiotic use last month for BV. Patient is sexually active.  Describes discharge as thick and white. Has not tried OTC medications without relief. Reports similar symptoms in the past and was diagnosed with BV and yeast. Denies fever, chills, nausea, vomiting, abdominal or pelvic pain, urinary symptoms, vaginal itching, vaginal odor, vaginal bleeding, dyspareunia, vaginal rashes or lesions.   Patient's last menstrual period was 09/05/2020. Current birth control method: Compliant with BC:  ROS: As per HPI.  All other pertinent ROS negative.     History reviewed. No pertinent past medical history. Past Surgical History:  Procedure Laterality Date   TONSILLECTOMY N/A 10/03/2013   Procedure: TONSILLECTOMY;  Surgeon: Serena Colonel, MD;  Location: [facility];  Service: ENT;  Laterality: N/A;   No Known Allergies No current facility-administered medications on file prior to encounter.   Current Outpatient Medications on File Prior to Encounter  Medication Sig Dispense Refill   Boric Acid Vaginal 600 MG SUPP Place 1 capsule vaginally daily as needed. 30 suppository 2   [DISCONTINUED] promethazine (PHENERGAN) 25 MG suppository Place 1 suppository (25 mg total) rectally every 6 (six) hours as needed for nausea or vomiting. (Patient not taking: Reported on 06/16/2019) 12 suppository 1    Social History   Socioeconomic History   Marital status: Single    Spouse name: Not on file   Number of children: Not on file   Years of education: Not on file   Highest education level: Not on file  Occupational History   Not on file  Tobacco Use   Smoking status: Never   Smokeless tobacco: Never  Vaping Use   Vaping Use: Never used   Substance and Sexual Activity   Alcohol use: Yes   Drug use: Yes    Types: Marijuana    Comment: occ   Sexual activity: Yes    Birth control/protection: None  Other Topics Concern   Not on file  Social History Narrative   Not on file   Social Determinants of Health   Financial Resource Strain: Medium Risk   Difficulty of Paying Living Expenses: Somewhat hard  Food Insecurity: No Food Insecurity   Worried About Programme researcher, broadcasting/film/video in the Last Year: Never true   Ran Out of Food in the Last Year: Never true  Transportation Needs: No Transportation Needs   Lack of Transportation (Medical): No   Lack of Transportation (Non-Medical): No  Physical Activity: Sufficiently Active   Days of Exercise per Week: 5 days   Minutes of Exercise per Session: 60 min  Stress: No Stress Concern Present   Feeling of Stress : Not at all  Social Connections: Socially Isolated   Frequency of Communication with Friends and Family: More than three times a week   Frequency of Social Gatherings with Friends and Family: Never   Attends Religious Services: Never   Database administrator or Organizations: No   Attends Engineer, structural: Never   Marital Status: Never married  Catering manager Violence: Not At Risk   Fear of Current or Ex-Partner: No   Emotionally Abused: No   Physically Abused: No   Sexually Abused: No   Family History  Problem Relation Age of Onset   Alcohol abuse Paternal  Grandfather    Hypertension Paternal Grandfather     OBJECTIVE:  Vitals:   09/26/20 1815  BP: 127/88  Pulse: 78  Resp: 20  Temp: 98.5 F (36.9 C)  SpO2: 98%     General appearance: Alert, NAD, appears stated age Head: NCAT Throat: lips, mucosa, and tongue normal; teeth and gums normal Lungs: CTA bilaterally without adventitious breath sounds Heart: regular rate and rhythm.  Radial pulses 2+ symmetrical bilaterally Back: no CVA tenderness Abdomen: soft, non-tender; bowel sounds normal;  no masses or organomegaly; no guarding or rebound tenderness GU: declines  Skin: warm and dry Psychological:  Alert and cooperative. Normal mood and affect.  LABS:  Results for orders placed or performed during the hospital encounter of 08/29/20  Cervicovaginal ancillary only  Result Value Ref Range   Neisseria Gonorrhea Negative    Chlamydia Negative    Trichomonas Negative    Bacterial Vaginitis (gardnerella) Positive (A)    Candida Vaginitis Positive (A)    Candida Glabrata Negative    Comment      Normal Reference Range Bacterial Vaginosis - Negative   Comment Normal Reference Ranger Chlamydia - Negative    Comment      Normal Reference Range Neisseria Gonorrhea - Negative   Comment Normal Reference Range Candida Species - Negative    Comment Normal Reference Range Candida Galbrata - Negative    Comment Normal Reference Range Trichomonas - Negative     Labs Reviewed  CERVICOVAGINAL ANCILLARY ONLY    ASSESSMENT & PLAN:  1. Acute vaginitis     Meds ordered this encounter  Medications   fluconazole (DIFLUCAN) 200 MG tablet    Sig: Take 1 tablet (200 mg total) by mouth once for 1 dose.    Dispense:  2 tablet    Refill:  0    Order Specific Question:   Supervising Provider    Answer:   [name] [[id number]]   metroNIDAZOLE (FLAGYL) 500 MG tablet    Sig: Take 1 tablet (500 mg total) by mouth 2 (two) times daily.    Dispense:  14 tablet    Refill:  0    Order Specific Question:   Supervising Provider    Answer:   [name] [[id number]]    Pending: Labs Reviewed  CERVICOVAGINAL ANCILLARY ONLY    Cytology self-swab obtained.   We will follow up with you regarding abnormal results Declines HIV/ syphilis testing today Prescribed metronidazole 500 mg twice daily for 7 days (do not take while consuming alcohol and/or if breastfeeding) Prescribed diflucan 150 mg once daily and then second dose 72 hours later Take medications as prescribed and to  completion If tests results are positive, please abstain from sexual activity until you and your partner(s) have been treated Follow up with PCP or [facility] if symptoms persists Return here or go to ER if you have any new or worsening symptoms fever, chills, nausea, vomiting, abdominal or pelvic pain, painful intercourse, persistent symptoms despite treatment Reviewed expectations re: course of current medical issues. Questions answered. Outlined signs and symptoms indicating need for more acute intervention. Patient verbalized understanding. After Visit Summary given.        Moshe Cipro, NP 09/26/20 1840

## 2020-09-27 LAB — CERVICOVAGINAL ANCILLARY ONLY
Bacterial Vaginitis (gardnerella): NEGATIVE
Candida Glabrata: NEGATIVE
Candida Vaginitis: NEGATIVE
Chlamydia: NEGATIVE
Comment: NEGATIVE
Comment: NEGATIVE
Comment: NEGATIVE
Comment: NEGATIVE
Comment: NEGATIVE
Comment: NORMAL
Neisseria Gonorrhea: NEGATIVE
Trichomonas: NEGATIVE

## 2020-10-24 ENCOUNTER — Encounter: Payer: Self-pay | Admitting: Emergency Medicine

## 2020-10-24 ENCOUNTER — Ambulatory Visit
Admission: EM | Admit: 2020-10-24 | Discharge: 2020-10-24 | Disposition: A | Discharge status: Home / Self Care | Payer: Medicaid Other | Attending: Family Medicine | Admitting: Family Medicine

## 2020-10-24 DIAGNOSIS — N898 Other specified noninflammatory disorders of vagina: Secondary | ICD-10-CM | POA: Insufficient documentation

## 2020-10-24 DIAGNOSIS — N760 Acute vaginitis: Secondary | ICD-10-CM | POA: Insufficient documentation

## 2020-10-24 MED ORDER — FLUCONAZOLE 150 MG PO TABS: ORAL_TABLET | ORAL | 0 refills | Status: DC

## 2020-10-24 MED ORDER — METRONIDAZOLE 500 MG PO TABS
500.0000 mg | ORAL_TABLET | Freq: Two times a day (BID) | ORAL | 0 refills | Status: DC
Start: 2020-10-24 — End: 2020-11-13

## 2020-10-24 MED ORDER — BORIC ACID VAGINAL 600 MG VA SUPP: 1.0000 | Freq: Every day | VAGINAL | 0 refills | Status: DC | PRN

## 2020-10-24 NOTE — Discharge Instructions (Addendum)
We have sent testing for sexually transmitted infections. We will notify you of any positive results once they are received. If required, we will prescribe any medications you might need.  Please refrain from all sexual activity for at least the next seven days.  

## 2020-10-24 NOTE — ED Triage Notes (Signed)
History of recurrent vaginitis. Itchy with milky discharge  since Monday.

## 2020-10-25 LAB — CERVICOVAGINAL ANCILLARY ONLY
Bacterial Vaginitis (gardnerella): POSITIVE — AB
Candida Glabrata: NEGATIVE
Candida Vaginitis: NEGATIVE
Chlamydia: NEGATIVE
Comment: NEGATIVE
Comment: NEGATIVE
Comment: NEGATIVE
Comment: NEGATIVE
Comment: NEGATIVE
Comment: NORMAL
Neisseria Gonorrhea: NEGATIVE
Trichomonas: POSITIVE — AB

## 2020-10-25 NOTE — ED Provider Notes (Signed)
[facility]   [id number] 10/24/20 Arrival Time: 1735  ASSESSMENT & PLAN:  1. Vaginal discharge   2. Acute vaginitis      Discharge Instructions      We have sent testing for sexually transmitted infections. We will notify you of any positive results once they are received. If required, we will prescribe any medications you might need.  Please refrain from all sexual activity for at least the next seven days.     Begin: Meds ordered this encounter  Medications   Boric Acid Vaginal 600 MG SUPP    Sig: Place 1 capsule vaginally daily as needed.    Dispense:  30 suppository    Refill:  0   metroNIDAZOLE (FLAGYL) 500 MG tablet    Sig: Take 1 tablet (500 mg total) by mouth 2 (two) times daily.    Dispense:  14 tablet    Refill:  0   fluconazole (DIFLUCAN) 150 MG tablet    Sig: Take one tablet by mouth as a single dose. May repeat in 3 days if symptoms persist.    Dispense:  2 tablet    Refill:  0   Requests trial of boric acid.  Without s/s of PID.  Pending: Labs Reviewed  CERVICOVAGINAL ANCILLARY ONLY   Will notify of any positive results. Instructed to refrain from sexual activity for at least seven days.  Reviewed expectations re: course of current medical issues. Questions answered. Outlined signs and symptoms indicating need for more acute intervention. Patient verbalized understanding. After Visit Summary given.   SUBJECTIVE:  Shelly Mccann is a 25 y.o. female who presents with complaint of vaginal discharge. Onset gradual. First noticed  a few d ago . Describes discharge as thin and clear; with odor. No specific aggravating or alleviating factors reported. Denies: urinary frequency, dysuria, and gross hematuria. Afebrile. No abdominal or pelvic pain. Normal PO intake wihout n/v. No genital rashes or lesions. No OTC tx. Reports freq BV and yeast infections.  Patient's last menstrual period was 10/03/2020 (exact date).   OBJECTIVE:  Vitals:    10/24/20 1747  BP: 134/84  Pulse: 93  Resp: 16  Temp: 98.8 F (37.1 C)  TempSrc: Oral  SpO2: 96%     General appearance: alert, cooperative, appears stated age and no distress Lungs: unlabored respirations; speaks full sentences without difficulty Back: no CVA tenderness; FROM at waist Abdomen: soft, non-tender GU: deferred Skin: warm and dry Psychological: alert and cooperative; normal mood and affect.   Labs Reviewed  CERVICOVAGINAL ANCILLARY ONLY    No Known Allergies  History reviewed. No pertinent past medical history. Family History  Problem Relation Age of Onset   Alcohol abuse Paternal Grandfather    Hypertension Paternal Grandfather    Social History   Socioeconomic History   Marital status: Single    Spouse name: Not on file   Number of children: Not on file   Years of education: Not on file   Highest education level: Not on file  Occupational History   Not on file  Tobacco Use   Smoking status: Never   Smokeless tobacco: Never  Vaping Use   Vaping Use: Never used  Substance and Sexual Activity   Alcohol use: Yes   Drug use: Yes    Types: Marijuana    Comment: occ   Sexual activity: Yes    Birth control/protection: None  Other Topics Concern   Not on file  Social History Narrative   Not on file  Social Determinants of Health   Financial Resource Strain: Medium Risk   Difficulty of Paying Living Expenses: Somewhat hard  Food Insecurity: No Food Insecurity   Worried About Programme researcher, broadcasting/film/video in the Last Year: Never true   Ran Out of Food in the Last Year: Never true  Transportation Needs: No Transportation Needs   Lack of Transportation (Medical): No   Lack of Transportation (Non-Medical): No  Physical Activity: Sufficiently Active   Days of Exercise per Week: 5 days   Minutes of Exercise per Session: 60 min  Stress: No Stress Concern Present   Feeling of Stress : Not at all  Social Connections: Socially Isolated   Frequency of  Communication with Friends and Family: More than three times a week   Frequency of Social Gatherings with Friends and Family: Never   Attends Religious Services: Never   Database administrator or Organizations: No   Attends Banker Meetings: Never   Marital Status: Never married  Catering manager Violence: Not At Risk   Fear of Current or Ex-Partner: No   Emotionally Abused: No   Physically Abused: No   Sexually Abused: No           Mardella Layman, MD 10/25/20 1026

## 2020-11-06 ENCOUNTER — Ambulatory Visit
Admission: RE | Admit: 2020-11-06 | Discharge: 2020-11-06 | Disposition: A | Discharge status: Home / Self Care | Payer: Medicaid Other | Source: Ambulatory Visit | Attending: Family Medicine | Admitting: Family Medicine

## 2020-11-06 ENCOUNTER — Other Ambulatory Visit: Payer: Self-pay

## 2020-11-06 VITALS — BP 116/81 | HR 94 | Temp 98.0°F | Ht 69.0 in | Wt 210.0 lb

## 2020-11-06 DIAGNOSIS — Z113 Encounter for screening for infections with a predominantly sexual mode of transmission: Secondary | ICD-10-CM

## 2020-11-06 LAB — POCT URINALYSIS DIP (MANUAL ENTRY)
Bilirubin, UA: NEGATIVE
Blood, UA: NEGATIVE
Glucose, UA: NEGATIVE mg/dL
Ketones, POC UA: NEGATIVE mg/dL
Leukocytes, UA: NEGATIVE
Nitrite, UA: NEGATIVE
Protein Ur, POC: NEGATIVE mg/dL
Spec Grav, UA: >=1.03 — AB (ref 1.010–1.025)
Urobilinogen, UA: 0.2 E.U./dL
pH, UA: 6 (ref 5.0–8.0)

## 2020-11-06 LAB — POCT URINE PREGNANCY: Preg Test, Ur: NEGATIVE

## 2020-11-06 NOTE — ED Triage Notes (Signed)
Pt states that she  was recently treated for trich and is having some lower abdominal pressure.  Pt states that she has some urinary urgency. Pt states that she took all of her antibiotic and would like to be tested to make sure she is not re infected.

## 2020-11-06 NOTE — ED Provider Notes (Signed)
f [facility]    CSN: [id number] Arrival date & time: 11/06/20  1818      History   Chief Complaint Chief Complaint  Patient presents with   Abdominal Pain    Pt states that she has some lower abdominal pressure, urinary urgency. Pt states that she still has symptoms from her last visit.     HPI Shelly Mccann is a 25 y.o. female.   HPI   Patient presents today for for STD testing.  She was here less than 3 weeks ago for STI testing and tested positive for trichomonas.  She completed the treatment and is here to get retested to ensure resolution of infection.  Current symptoms include lower abdominal pressure she also has had urinary frequency and urgency.  No new sexual partners.  Partner has been treated for treat.    History reviewed. No pertinent past medical history.  Patient Active Problem List   Diagnosis Date Noted   S/P tonsillectomy 10/03/2013    Past Surgical History:  Procedure Laterality Date   TONSILLECTOMY N/A 10/03/2013   Procedure: TONSILLECTOMY;  Surgeon: Serena Colonel, MD;  Location: [facility];  Service: ENT;  Laterality: N/A;    OB History     Gravida  2   Para      Term      Preterm      AB  2   Living         SAB  1   IAB  1   Ectopic      Multiple      Live Births               Home Medications    Prior to Admission medications   Medication Sig Start Date End Date Taking? Authorizing Provider  Boric Acid Vaginal 600 MG SUPP Place 1 capsule vaginally daily as needed. 10/24/20   Mardella Layman, MD  fluconazole (DIFLUCAN) 150 MG tablet Take one tablet by mouth as a single dose. May repeat in 3 days if symptoms persist. 10/24/20   Mardella Layman, MD  metroNIDAZOLE (FLAGYL) 500 MG tablet Take 1 tablet (500 mg total) by mouth 2 (two) times daily. 10/24/20   Mardella Layman, MD  promethazine (PHENERGAN) 25 MG suppository Place 1 suppository (25 mg total) rectally every 6 (six) hours as needed for nausea or  vomiting. Patient not taking: Reported on 06/16/2019 10/03/13 11/07/19  Serena Colonel, MD    Family History Family History  Problem Relation Age of Onset   Alcohol abuse Paternal Grandfather    Hypertension Paternal Grandfather     Social History Social History   Tobacco Use   Smoking status: Never   Smokeless tobacco: Never  Vaping Use   Vaping Use: Never used  Substance Use Topics   Alcohol use: Yes   Drug use: Yes    Types: Marijuana    Comment: occ     Allergies   Patient has no known allergies.   Review of Systems Review of Systems Pertinent negatives listed in HPI   Physical Exam Triage Vital Signs ED Triage Vitals  Enc Vitals Group     BP 11/06/20 1833 116/81     Pulse Rate 11/06/20 1833 94     Resp --      Temp 11/06/20 1833 98 F (36.7 C)     Temp Source 11/06/20 1833 Oral     SpO2 11/06/20 1833 98 %     Weight 11/06/20 1831 210  lb (95.3 kg)     Height 11/06/20 1831 5\' 9"  (1.753 m)     Head Circumference --      Peak Flow --      Pain Score 11/06/20 1830 4     Pain Loc --      Pain Edu? --      Excl. in GC? --    No data found.  Updated Vital Signs BP 116/81 (BP Location: Right Arm)   Pulse 94   Temp 98 F (36.7 C) (Oral)   Ht 5\' 9"  (1.753 m)   Wt 210 lb (95.3 kg)   LMP 10/30/2020   SpO2 98%   BMI 31.01 kg/m   Visual Acuity Right Eye Distance:   Left Eye Distance:   Bilateral Distance:    Right Eye Near:   Left Eye Near:    Bilateral Near:     Physical Exam Constitutional:      Appearance: She is well-developed.  HENT:     Head: Normocephalic.  Cardiovascular:     Rate and Rhythm: Normal rate and regular rhythm.  Pulmonary:     Effort: Pulmonary effort is normal.     Breath sounds: Normal breath sounds.  Skin:    General: Skin is warm.     Capillary Refill: Capillary refill takes less than 2 seconds.  Neurological:     General: No focal deficit present.     Mental Status: She is alert.  Psychiatric:        Mood and  Affect: Mood normal.        Behavior: Behavior normal.  Vaginal cytology self collected   UC Treatments / Results  Labs (all labs ordered are listed, but only abnormal results are displayed) Labs Reviewed  POCT URINALYSIS DIP (MANUAL ENTRY) - Abnormal; Notable for the following components:      Result Value   Spec Grav, UA >=1.030 (*)    All other components within normal limits  HIV ANTIBODY (ROUTINE TESTING W REFLEX)   Narrative:    Performed at:  [facility] [address] [place], [address]  [place] Lab Director: [place] MD, Phone:  [phone]  [facility]   Narrative:    Performed at:  [address] [place] [address], [place], [address]  [place] Lab Director: [place] MD, Phone:  [phone]  POCT URINE PREGNANCY  CERVICOVAGINAL ANCILLARY ONLY    EKG   Radiology No results found.  Procedures Procedures (including critical care time)  Medications Ordered in UC Medications - No data to display  Initial Impression / Assessment and Plan / UC Course  I have reviewed the triage vital signs and the nursing notes.  Pertinent labs & imaging results that were available during my care of the patient were reviewed by me and considered in my medical decision making (see chart for details).   Educated that routine repeated STD testing or cure is not indicated immediately following completion of therapy. STI testing every 3 months without a known exposure is consistent with CDC recommendation in addition to use of barrier protection is the most appropriate method in preventing STI. UA unremarkable. Urine HCG negative. Encouraged follow-up with gynecology for management of sexual / reproductive heath concerns. Cytology and HIV/RPR pending. Final Clinical Impressions(s) / UC Diagnoses   Final diagnoses:  Screening examination for STD (sexually transmitted disease)   Discharge Instructions   None    ED Prescriptions   None    PDMP not reviewed this  encounter.  Bing Neighbors, FNP 11/07/20 [phone]

## 2020-11-07 LAB — CERVICOVAGINAL ANCILLARY ONLY
Bacterial Vaginitis (gardnerella): NEGATIVE
Candida Glabrata: NEGATIVE
Candida Vaginitis: NEGATIVE
Chlamydia: NEGATIVE
Comment: NEGATIVE
Comment: NEGATIVE
Comment: NEGATIVE
Comment: NEGATIVE
Comment: NEGATIVE
Comment: NORMAL
Neisseria Gonorrhea: NEGATIVE
Trichomonas: NEGATIVE

## 2020-11-07 LAB — RPR: RPR Ser Ql: NONREACTIVE

## 2020-11-07 LAB — HIV ANTIBODY (ROUTINE TESTING W REFLEX): HIV Screen 4th Generation wRfx: NONREACTIVE

## 2020-11-12 ENCOUNTER — Ambulatory Visit
Admission: RE | Admit: 2020-11-12 | Discharge: 2020-11-12 | Disposition: A | Discharge status: Home / Self Care | Payer: Medicaid Other | Source: Ambulatory Visit | Attending: Family Medicine | Admitting: Family Medicine

## 2020-11-12 VITALS — BP 115/79 | HR 82 | Temp 98.2°F | Resp 16

## 2020-11-12 DIAGNOSIS — N760 Acute vaginitis: Secondary | ICD-10-CM

## 2020-11-12 NOTE — ED Triage Notes (Signed)
White discharge with odor x 3 days.

## 2020-11-13 ENCOUNTER — Telehealth: Payer: Self-pay | Admitting: Family Medicine

## 2020-11-13 LAB — CERVICOVAGINAL ANCILLARY ONLY
Bacterial Vaginitis (gardnerella): POSITIVE — AB
Candida Glabrata: NEGATIVE
Candida Vaginitis: NEGATIVE
Chlamydia: NEGATIVE
Comment: NEGATIVE
Comment: NEGATIVE
Comment: NEGATIVE
Comment: NEGATIVE
Comment: NEGATIVE
Comment: NORMAL
Neisseria Gonorrhea: NEGATIVE
Trichomonas: NEGATIVE

## 2020-11-13 MED ORDER — FLUCONAZOLE 150 MG PO TABS: 150.0000 mg | ORAL_TABLET | Freq: Once | ORAL | 0 refills | Status: AC

## 2020-11-13 MED ORDER — METRONIDAZOLE 500 MG PO TABS: 500.0000 mg | ORAL_TABLET | Freq: Two times a day (BID) | ORAL | 0 refills | Status: DC

## 2020-11-13 NOTE — Telephone Encounter (Signed)
BV positive on cytology. Metronidazole and Diflucan(prophylaxis against yeast) sent to pharmacy.

## 2020-11-13 NOTE — ED Provider Notes (Signed)
[facility]    CSN: [id number] Arrival date & time: 11/12/20  1747      History   Chief Complaint No chief complaint on file.   HPI Shelly Mccann is a 25 y.o. female.   HPI Patient seen here on 11/06/2020 for STD screening returns today with a concern of 3 days of vaginal discharge changes.  No known exposure to STD.  Cytology collected less than a week ago was negative.  Patient is concerned for either BV or yeast.   History reviewed. No pertinent past medical history.  Patient Active Problem List   Diagnosis Date Noted   S/P tonsillectomy 10/03/2013    Past Surgical History:  Procedure Laterality Date   TONSILLECTOMY N/A 10/03/2013   Procedure: TONSILLECTOMY;  Surgeon: Serena Colonel, MD;  Location: [facility];  Service: ENT;  Laterality: N/A;    OB History     Gravida  2   Para      Term      Preterm      AB  2   Living         SAB  1   IAB  1   Ectopic      Multiple      Live Births               Home Medications    Prior to Admission medications   Medication Sig Start Date End Date Taking? Authorizing Provider  Boric Acid Vaginal 600 MG SUPP Place 1 capsule vaginally daily as needed. 10/24/20   Mardella Layman, MD  fluconazole (DIFLUCAN) 150 MG tablet Take one tablet by mouth as a single dose. May repeat in 3 days if symptoms persist. 10/24/20   Mardella Layman, MD  metroNIDAZOLE (FLAGYL) 500 MG tablet Take 1 tablet (500 mg total) by mouth 2 (two) times daily. 10/24/20   Mardella Layman, MD  promethazine (PHENERGAN) 25 MG suppository Place 1 suppository (25 mg total) rectally every 6 (six) hours as needed for nausea or vomiting. Patient not taking: Reported on 06/16/2019 10/03/13 11/07/19  Serena Colonel, MD    Family History Family History  Problem Relation Age of Onset   Alcohol abuse Paternal Grandfather    Hypertension Paternal Grandfather     Social History Social History   Tobacco Use   Smoking status: Never    Smokeless tobacco: Never  Vaping Use   Vaping Use: Never used  Substance Use Topics   Alcohol use: Yes   Drug use: Yes    Types: Marijuana    Comment: occ     Allergies   Patient has no known allergies.   Review of Systems Review of Systems Pertinent negatives listed in HPI   Physical Exam Triage Vital Signs ED Triage Vitals  Enc Vitals Group     BP 11/12/20 1833 115/79     Pulse Rate 11/12/20 1833 82     Resp 11/12/20 1833 16     Temp 11/12/20 1833 98.2 F (36.8 C)     Temp Source 11/12/20 1833 Oral     SpO2 11/12/20 1833 98 %     Weight --      Height --      Head Circumference --      Peak Flow --      Pain Score 11/12/20 1832 0     Pain Loc --      Pain Edu? --      Excl. in GC? --  No data found.  Updated Vital Signs BP 115/79 (BP Location: Right Arm)   Pulse 82   Temp 98.2 F (36.8 C) (Oral)   Resp 16   LMP 10/30/2020   SpO2 98%   Visual Acuity Right Eye Distance:   Left Eye Distance:   Bilateral Distance:    Right Eye Near:   Left Eye Near:    Bilateral Near:     Physical Exam Constitutional:      Appearance: Normal appearance.  HENT:     Head: Normocephalic and atraumatic.  Cardiovascular:     Rate and Rhythm: Normal rate and regular rhythm.  Pulmonary:     Effort: Pulmonary effort is normal.     Breath sounds: Normal breath sounds.  Skin:    General: Skin is warm and dry.     Capillary Refill: Capillary refill takes less than 2 seconds.  Neurological:     General: No focal deficit present.     Mental Status: She is alert.  Psychiatric:        Mood and Affect: Mood normal.        Behavior: Behavior normal.        Thought Content: Thought content normal.        Judgment: Judgment normal.   Cytology self collected.    UC Treatments / Results  Labs (all labs ordered are listed, but only abnormal results are displayed) Labs Reviewed  CERVICOVAGINAL ANCILLARY ONLY    EKG   Radiology No results  found.  Procedures Procedures (including critical care time)  Medications Ordered in UC Medications - No data to display  Initial Impression / Assessment and Plan / UC Course  I have reviewed the triage vital signs and the nursing notes.  Pertinent labs & imaging results that were available during my care of the patient were reviewed by me and considered in my medical decision making (see chart for details).     Cytology collected less than a week ago was negative.  Patient has had multiple visits over the last few months for STI screenings.  Encouraged that she needs to establish with a OB/GYN for more specialized care given her concerns. Patient reports that she will be getting new insurance that provide her with additional coverage to be able to see a specialty provider next month.  Advised that I will not prescribe any medications unless the cytology indicates the need for treatment given that her most recent cytology was negative. Final Clinical Impressions(s) / UC Diagnoses   Final diagnoses:  None   Discharge Instructions   None    ED Prescriptions   None    PDMP not reviewed this encounter.   Bing Neighbors, [place] 11/13/20 [phone]

## 2020-12-16 ENCOUNTER — Encounter: Payer: Self-pay | Admitting: Emergency Medicine

## 2020-12-16 ENCOUNTER — Ambulatory Visit
Admission: EM | Admit: 2020-12-16 | Discharge: 2020-12-16 | Disposition: A | Discharge status: Home / Self Care | Payer: Medicaid Other | Attending: Family Medicine | Admitting: Family Medicine

## 2020-12-16 DIAGNOSIS — N760 Acute vaginitis: Secondary | ICD-10-CM

## 2020-12-16 MED ORDER — FLUCONAZOLE 150 MG PO TABS: 150.0000 mg | ORAL_TABLET | Freq: Once | ORAL | 0 refills | Status: AC

## 2020-12-16 MED ORDER — METRONIDAZOLE 500 MG PO TABS: 500.0000 mg | ORAL_TABLET | Freq: Two times a day (BID) | ORAL | 0 refills | Status: DC

## 2020-12-16 NOTE — Discharge Instructions (Addendum)
Vaginal cytology will result within 1 to 2 days.  Recommend starting with Diflucan given symptoms are most consistent with that of a yeast infection while waiting for cytology to result.  If cytology is positive for BV would recommend starting treatment with metronidazole.  Follow-up with OB/GYN as needed.

## 2020-12-16 NOTE — ED Provider Notes (Signed)
[facility]    CSN: [id number] Arrival date & time: 12/16/20  6606      History   Chief Complaint No chief complaint on file.   HPI Shelly Mccann is a 25 y.o. female.   HPI Patient with recurrent vaginitis presents today with changes to vaginal discharge.  Patient reports over a week of thickened copious vaginal discharge.  No changes in sexual partners.  Last cytology was positive for BV.  Patient reports completing treatment.  Denies urinary symptoms.  History reviewed. No pertinent past medical history.  Patient Active Problem List   Diagnosis Date Noted   S/P tonsillectomy 10/03/2013    Past Surgical History:  Procedure Laterality Date   TONSILLECTOMY N/A 10/03/2013   Procedure: TONSILLECTOMY;  Surgeon: Serena Colonel, MD;  Location: [facility];  Service: ENT;  Laterality: N/A;    OB History     Gravida  2   Para      Term      Preterm      AB  2   Living         SAB  1   IAB  1   Ectopic      Multiple      Live Births               Home Medications    Prior to Admission medications   Medication Sig Start Date End Date Taking? Authorizing Provider  fluconazole (DIFLUCAN) 150 MG tablet Take 1 tablet (150 mg total) by mouth once for 1 dose. Repeat if needed 12/16/20 12/16/20 Yes Bing Neighbors, FNP  Boric Acid Vaginal 600 MG SUPP Place 1 capsule vaginally daily as needed. 10/24/20   Mardella Layman, MD  metroNIDAZOLE (FLAGYL) 500 MG tablet Take 1 tablet (500 mg total) by mouth 2 (two) times daily. 12/16/20   Bing Neighbors, FNP  promethazine (PHENERGAN) 25 MG suppository Place 1 suppository (25 mg total) rectally every 6 (six) hours as needed for nausea or vomiting. Patient not taking: Reported on 06/16/2019 10/03/13 11/07/19  Serena Colonel, MD    Family History Family History  Problem Relation Age of Onset   Alcohol abuse Paternal Grandfather    Hypertension Paternal Grandfather     Social History Social  History   Tobacco Use   Smoking status: Never   Smokeless tobacco: Never  Vaping Use   Vaping Use: Never used  Substance Use Topics   Alcohol use: Yes   Drug use: Yes    Types: Marijuana    Comment: occ     Allergies   Patient has no known allergies.   Review of Systems Review of Systems Pertinent negatives listed in HPI   Physical Exam Triage Vital Signs ED Triage Vitals  Enc Vitals Group     BP 12/16/20 0936 102/74     Pulse Rate 12/16/20 0936 76     Resp 12/16/20 0936 18     Temp 12/16/20 0936 98.8 F (37.1 C)     Temp Source 12/16/20 0936 Oral     SpO2 12/16/20 0936 97 %     Weight --      Height --      Head Circumference --      Peak Flow --      Pain Score 12/16/20 0937 0     Pain Loc --      Pain Edu? --      Excl. in GC? --    No  data found.  Updated Vital Signs BP 102/74 (BP Location: Right Arm)   Pulse 76   Temp 98.8 F (37.1 C) (Oral)   Resp 18   LMP 11/25/2020   SpO2 97%   Visual Acuity Right Eye Distance:   Left Eye Distance:   Bilateral Distance:    Right Eye Near:   Left Eye Near:    Bilateral Near:     Physical Exam General appearance: Alert, well developed, well nourished, cooperative  Head: Normocephalic, without obvious abnormality, atraumatic Respiratory: Respirations even and unlabored, normal respiratory rate Heart: Rate and rhythm normal Extremities: No gross deformities Skin: Skin color, texture, turgor normal. No rashes seen  Psych: Appropriate mood and affect. Neurologic: GCS 15, normal coordination, normal gait   Vaginal cytology self collected  UC Treatments / Results  Labs (all labs ordered are listed, but only abnormal results are displayed) Labs Reviewed  CERVICOVAGINAL ANCILLARY ONLY    EKG   Radiology No results found.  Procedures Procedures (including critical care time)  Medications Ordered in UC Medications - No data to display  Initial Impression / Assessment and Plan / UC Course  I  have reviewed the triage vital signs and the nursing notes.  Pertinent labs & imaging results that were available during my care of the patient were reviewed by me and considered in my medical decision making (see chart for details).    Patient with history of recurrent vaginitis treating today with Diflucan and metronidazole for coverage of both yeast and BV.  Cytology pending.  Discharge instructions provided.  Follow-up with OB/GYN for further work-up and evaluation of recurrent vaginitis symptoms. Final Clinical Impressions(s) / UC Diagnoses   Final diagnoses:  Vaginitis and vulvovaginitis     Discharge Instructions      Vaginal cytology will result within 1 to 2 days.  Recommend starting with Diflucan given symptoms are most consistent with that of a yeast infection while waiting for cytology to result.  If cytology is positive for BV would recommend starting treatment with metronidazole.  Follow-up with OB/GYN as needed.     ED Prescriptions     Medication Sig Dispense Auth. Provider   metroNIDAZOLE (FLAGYL) 500 MG tablet Take 1 tablet (500 mg total) by mouth 2 (two) times daily. 14 tablet Bing Neighbors, FNP   fluconazole (DIFLUCAN) 150 MG tablet Take 1 tablet (150 mg total) by mouth once for 1 dose. Repeat if needed 2 tablet Bing Neighbors, FNP      PDMP not reviewed this encounter.   Bing Neighbors, FNP 12/19/20 1009

## 2020-12-16 NOTE — ED Triage Notes (Signed)
Milky vaginal discharge x 1 week.  Pt states she thinks she has a yeast infection.

## 2020-12-17 LAB — CERVICOVAGINAL ANCILLARY ONLY
Bacterial Vaginitis (gardnerella): POSITIVE — AB
Candida Glabrata: NEGATIVE
Candida Vaginitis: NEGATIVE
Chlamydia: NEGATIVE
Comment: NEGATIVE
Comment: NEGATIVE
Comment: NEGATIVE
Comment: NEGATIVE
Comment: NEGATIVE
Comment: NORMAL
Neisseria Gonorrhea: NEGATIVE
Trichomonas: NEGATIVE

## 2021-01-10 ENCOUNTER — Other Ambulatory Visit: Payer: Self-pay

## 2021-01-10 ENCOUNTER — Ambulatory Visit
Admission: EM | Admit: 2021-01-10 | Discharge: 2021-01-10 | Disposition: A | Discharge status: Home / Self Care | Payer: Medicaid Other | Attending: Internal Medicine | Admitting: Internal Medicine

## 2021-01-10 ENCOUNTER — Encounter: Payer: Self-pay | Admitting: Emergency Medicine

## 2021-01-10 DIAGNOSIS — N760 Acute vaginitis: Secondary | ICD-10-CM

## 2021-01-10 MED ORDER — FLUCONAZOLE 150 MG PO TABS: 150.0000 mg | ORAL_TABLET | Freq: Once | ORAL | 0 refills | Status: AC

## 2021-01-10 NOTE — ED Provider Notes (Signed)
[facility]    CSN: [id number] Arrival date & time: 01/10/21  1846      History   Chief Complaint No chief complaint on file.   HPI Shelly Mccann is a 25 y.o. female comes to the urgent care with 3-day history of white spots in the vagina.  She denies any vaginal discharge.  She denies any itching in the vaginal area.  No dysuria urgency or frequency.  No abdominal pain.Marland Kitchen   HPI  History reviewed. No pertinent past medical history.  Patient Active Problem List   Diagnosis Date Noted   S/P tonsillectomy 10/03/2013    Past Surgical History:  Procedure Laterality Date   TONSILLECTOMY N/A 10/03/2013   Procedure: TONSILLECTOMY;  Surgeon: Serena Colonel, MD;  Location: [facility];  Service: ENT;  Laterality: N/A;    OB History     Gravida  2   Para      Term      Preterm      AB  2   Living         SAB  1   IAB  1   Ectopic      Multiple      Live Births               Home Medications    Prior to Admission medications   Medication Sig Start Date End Date Taking? Authorizing Provider  fluconazole (DIFLUCAN) 150 MG tablet Take 1 tablet (150 mg total) by mouth once for 1 dose. 01/10/21 01/10/21 Yes Kyan Giannone, Britta Mccreedy, MD  promethazine (PHENERGAN) 25 MG suppository Place 1 suppository (25 mg total) rectally every 6 (six) hours as needed for nausea or vomiting. Patient not taking: Reported on 06/16/2019 10/03/13 11/07/19  Serena Colonel, MD    Family History Family History  Problem Relation Age of Onset   Alcohol abuse Paternal Grandfather    Hypertension Paternal Grandfather     Social History Social History   Tobacco Use   Smoking status: Never   Smokeless tobacco: Never  Vaping Use   Vaping Use: Never used  Substance Use Topics   Alcohol use: Yes   Drug use: Yes    Types: Marijuana    Comment: occ     Allergies   Patient has no known allergies.   Review of Systems Review of Systems  Genitourinary:  Negative  for dysuria, frequency, hematuria, vaginal bleeding, vaginal discharge and vaginal pain.    Physical Exam Triage Vital Signs ED Triage Vitals [01/10/21 1854]  Enc Vitals Group     BP (!) 145/96     Pulse Rate 80     Resp 18     Temp 98.1 F (36.7 C)     Temp src      SpO2 97 %     Weight      Height      Head Circumference      Peak Flow      Pain Score 0     Pain Loc      Pain Edu?      Excl. in GC?    No data found.  Updated Vital Signs BP (!) 145/96 (BP Location: Right Arm)   Pulse 80   Temp 98.1 F (36.7 C)   Resp 18   LMP 12/20/2020 (Exact Date)   SpO2 97%   Visual Acuity Right Eye Distance:   Left Eye Distance:   Bilateral Distance:    Right Eye Near:  Left Eye Near:    Bilateral Near:     Physical Exam Vitals and nursing note reviewed.  Cardiovascular:     Rate and Rhythm: Normal rate and regular rhythm.  Pulmonary:     Effort: Pulmonary effort is normal.     Breath sounds: Normal breath sounds.  Abdominal:     General: Bowel sounds are normal.     Palpations: Abdomen is soft.     UC Treatments / Results  Labs (all labs ordered are listed, but only abnormal results are displayed) Labs Reviewed  CERVICOVAGINAL ANCILLARY ONLY    EKG   Radiology No results found.  Procedures Procedures (including critical care time)  Medications Ordered in UC Medications - No data to display  Initial Impression / Assessment and Plan / UC Course  I have reviewed the triage vital signs and the nursing notes.  Pertinent labs & imaging results that were available during my care of the patient were reviewed by me and considered in my medical decision making (see chart for details).     1.  Acute vaginitis: Cervicovaginal swab for GC/chlamydia/trichomonas/vaginal yeast/BV Fluconazole sent to the pharmacy We will call patient with recommendations if labs are abnormal. Final Clinical Impressions(s) / UC Diagnoses   Final diagnoses:  Acute vaginitis      Discharge Instructions      Take medications as prescribed We will call you with recommendations if labs are abnormal    ED Prescriptions     Medication Sig Dispense Auth. Provider   fluconazole (DIFLUCAN) 150 MG tablet Take 1 tablet (150 mg total) by mouth once for 1 dose. 2 tablet Sarajean Dessert, Britta Mccreedy, MD      PDMP not reviewed this encounter.   [name], MD 01/10/21 Barry Brunner

## 2021-01-10 NOTE — ED Triage Notes (Signed)
States she has notice some white color vaginal discharged x 3 days.

## 2021-01-10 NOTE — Discharge Instructions (Addendum)
Take medications as prescribed.  We will call you with recommendations if labs are abnormal. 

## 2021-01-11 LAB — CERVICOVAGINAL ANCILLARY ONLY
Bacterial Vaginitis (gardnerella): POSITIVE — AB
Candida Glabrata: NEGATIVE
Candida Vaginitis: POSITIVE — AB
Chlamydia: NEGATIVE
Comment: NEGATIVE
Comment: NEGATIVE
Comment: NEGATIVE
Comment: NEGATIVE
Comment: NEGATIVE
Comment: NORMAL
Neisseria Gonorrhea: NEGATIVE
Trichomonas: NEGATIVE

## 2021-01-14 ENCOUNTER — Telehealth (HOSPITAL_COMMUNITY): Payer: Self-pay | Admitting: Emergency Medicine

## 2021-01-14 MED ORDER — METRONIDAZOLE 500 MG PO TABS: 500.0000 mg | ORAL_TABLET | Freq: Two times a day (BID) | ORAL | 0 refills | Status: DC

## 2021-01-26 ENCOUNTER — Ambulatory Visit
Admission: RE | Admit: 2021-01-26 | Discharge: 2021-01-26 | Disposition: A | Discharge status: Home / Self Care | Payer: Medicaid Other | Source: Ambulatory Visit | Attending: Family Medicine | Admitting: Family Medicine

## 2021-01-26 ENCOUNTER — Other Ambulatory Visit: Payer: Self-pay

## 2021-01-26 VITALS — BP 119/84 | HR 95 | Temp 98.2°F | Ht 69.0 in | Wt 210.0 lb

## 2021-01-26 DIAGNOSIS — N898 Other specified noninflammatory disorders of vagina: Secondary | ICD-10-CM

## 2021-01-26 MED ORDER — FLUCONAZOLE 150 MG PO TABS: ORAL_TABLET | ORAL | 0 refills | Status: DC

## 2021-01-26 MED ORDER — METRONIDAZOLE 500 MG PO TABS: 500.0000 mg | ORAL_TABLET | Freq: Two times a day (BID) | ORAL | 0 refills | Status: DC

## 2021-01-26 NOTE — ED Triage Notes (Signed)
Pt states that she has some vaginal discharge. Pt state that she finished up medications from recent visit and was using tampon which she feels brought on he discharge.

## 2021-01-26 NOTE — Discharge Instructions (Signed)
We have sent testing for causes of vaginal infections. We will notify you of any positive results once they are received. If required, we will prescribe any medications you might need.  Please refrain from all sexual activity for at least the next seven days.  

## 2021-01-28 LAB — CYTOLOGY, (ORAL, ANAL, URETHRAL) ANCILLARY ONLY
Chlamydia: NEGATIVE
Comment: NEGATIVE
Comment: NORMAL
Neisseria Gonorrhea: NEGATIVE

## 2021-01-28 NOTE — ED Provider Notes (Signed)
[facility]   [id number] 01/26/21 Arrival Time: 1110  ASSESSMENT & PLAN:  1. Vaginal discharge    Meds ordered this encounter  Medications   metroNIDAZOLE (FLAGYL) 500 MG tablet    Sig: Take 1 tablet (500 mg total) by mouth 2 (two) times daily.    Dispense:  14 tablet    Refill:  0   fluconazole (DIFLUCAN) 150 MG tablet    Sig: Take one tablet by mouth as a single dose. May repeat in 3 days if symptoms persist.    Dispense:  2 tablet    Refill:  0      Discharge Instructions      We have sent testing for causes of vaginal infections. We will notify you of any positive results once they are received. If required, we will prescribe any medications you might need.  Please refrain from all sexual activity for at least the next seven days.     Without s/s of PID.  Labs Reviewed  CYTOLOGY, (ORAL, ANAL, URETHRAL) ANCILLARY ONLY    Will notify of any positive results. Instructed to refrain from sexual activity for at least seven days.  Reviewed expectations re: course of current medical issues. Questions answered. Outlined signs and symptoms indicating need for more acute intervention. Patient verbalized understanding. After Visit Summary given.   SUBJECTIVE:  Shelly Mccann is a 25 y.o. female who presents with complaint of vaginal discharge/irritation. Freq h/o yeast and BV. No new sexual partners. Onset abrupt. First noticed  sev d ago .  No abdominal or pelvic pain. Normal PO intake wihout n/v. No genital rashes or lesions. No OTC tx.  Patient's last menstrual period was 01/17/2021.   OBJECTIVE:  Vitals:   01/26/21 1124 01/26/21 1126  BP:  119/84  Pulse:  95  Temp:  98.2 F (36.8 C)  TempSrc:  Oral  SpO2:  98%  Weight: 95.3 kg   Height: 5\' 9"  (1.753 m)      General appearance: alert, cooperative, appears stated age and no distress Lungs: unlabored respirations; speaks full sentences without difficulty Back: no CVA tenderness; FROM at  waist Abdomen: soft, non-tender GU: deferred Skin: warm and dry Psychological: alert and cooperative; normal mood and affect.   Labs Reviewed  CYTOLOGY, (ORAL, ANAL, URETHRAL) ANCILLARY ONLY    No Known Allergies  History reviewed. No pertinent past medical history. Family History  Problem Relation Age of Onset   Alcohol abuse Paternal Grandfather    Hypertension Paternal Grandfather    Social History   Socioeconomic History   Marital status: Single    Spouse name: Not on file   Number of children: Not on file   Years of education: Not on file   Highest education level: Not on file  Occupational History   Not on file  Tobacco Use   Smoking status: Never   Smokeless tobacco: Never  Vaping Use   Vaping Use: Never used  Substance and Sexual Activity   Alcohol use: Yes   Drug use: Yes    Types: Marijuana    Comment: occ   Sexual activity: Yes    Birth control/protection: None  Other Topics Concern   Not on file  Social History Narrative   Not on file   Social Determinants of Health   Financial Resource Strain: Medium Risk   Difficulty of Paying Living Expenses: Somewhat hard  Food Insecurity: No Food Insecurity   Worried About Running Out of Food in the Last Year: Never true  Ran Out of Food in the Last Year: Never true  Transportation Needs: No Transportation Needs   Lack of Transportation (Medical): No   Lack of Transportation (Non-Medical): No  Physical Activity: Sufficiently Active   Days of Exercise per Week: 5 days   Minutes of Exercise per Session: 60 min  Stress: No Stress Concern Present   Feeling of Stress : Not at all  Social Connections: Socially Isolated   Frequency of Communication with Friends and Family: More than three times a week   Frequency of Social Gatherings with Friends and Family: Never   Attends Religious Services: Never   Database administrator or Organizations: No   Attends Banker Meetings: Never   Marital  Status: Never married  Catering manager Violence: Not At Risk   Fear of Current or Ex-Partner: No   Emotionally Abused: No   Physically Abused: No   Sexually Abused: No           Mardella Layman, MD 01/28/21 [phone]

## 2021-03-21 ENCOUNTER — Ambulatory Visit
Admission: RE | Admit: 2021-03-21 | Discharge: 2021-03-21 | Disposition: A | Discharge status: Home / Self Care | Payer: Self-pay | Source: Ambulatory Visit | Attending: Urgent Care | Admitting: Urgent Care

## 2021-03-21 ENCOUNTER — Other Ambulatory Visit: Payer: Self-pay

## 2021-03-21 VITALS — BP 122/82 | HR 68 | Temp 98.0°F | Resp 18

## 2021-03-21 DIAGNOSIS — B9689 Other specified bacterial agents as the cause of diseases classified elsewhere: Secondary | ICD-10-CM | POA: Insufficient documentation

## 2021-03-21 DIAGNOSIS — N760 Acute vaginitis: Secondary | ICD-10-CM | POA: Insufficient documentation

## 2021-03-21 DIAGNOSIS — N898 Other specified noninflammatory disorders of vagina: Secondary | ICD-10-CM | POA: Insufficient documentation

## 2021-03-21 DIAGNOSIS — Z7251 High risk heterosexual behavior: Secondary | ICD-10-CM | POA: Insufficient documentation

## 2021-03-21 DIAGNOSIS — B3731 Acute candidiasis of vulva and vagina: Secondary | ICD-10-CM | POA: Insufficient documentation

## 2021-03-21 LAB — POCT URINE PREGNANCY: Preg Test, Ur: NEGATIVE

## 2021-03-21 MED ORDER — METRONIDAZOLE 500 MG PO TABS: 500.0000 mg | ORAL_TABLET | Freq: Two times a day (BID) | ORAL | 0 refills | Status: DC

## 2021-03-21 MED ORDER — FLUCONAZOLE 150 MG PO TABS: 150.0000 mg | ORAL_TABLET | ORAL | 0 refills | Status: DC

## 2021-03-21 NOTE — ED Provider Notes (Signed)
[facility]   MRN: [id number] DOB: 1995/11/14  Subjective:   Shelly Mccann is a 25 y.o. female presenting for 1 week history of persistent and recurrent vaginal discharge, vaginal discomfort.  She states that the irritation of the vaginal area is related to using a new pad.  She has a lot of sensitivity to the skin of the labia.  No vesicular lesions or blisters.  Patient has never been diagnosed with genital herpes.  Patient is sexually active, wants complete panel of STIs including RPR and HIV. Chart review shows patient has a history of persistent and recurrent BV and yeast infections. Last episode was 01/10/2021. Has a history of trichomoniasis 10/24/2020.  No current facility-administered medications for this encounter.  Current Outpatient Medications:    fluconazole (DIFLUCAN) 150 MG tablet, Take one tablet by mouth as a single dose. May repeat in 3 days if symptoms persist., Disp: 2 tablet, Rfl: 0   metroNIDAZOLE (FLAGYL) 500 MG tablet, Take 1 tablet (500 mg total) by mouth 2 (two) times daily., Disp: 14 tablet, Rfl: 0   No Known Allergies  History reviewed. No pertinent past medical history.   Past Surgical History:  Procedure Laterality Date   TONSILLECTOMY N/A 10/03/2013   Procedure: TONSILLECTOMY;  Surgeon: Serena Colonel, MD;  Location: [facility];  Service: ENT;  Laterality: N/A;    Family History  Problem Relation Age of Onset   Alcohol abuse Paternal Grandfather    Hypertension Paternal Grandfather     Social History   Tobacco Use   Smoking status: Never   Smokeless tobacco: Never  Vaping Use   Vaping Use: Never used  Substance Use Topics   Alcohol use: Yes   Drug use: Yes    Types: Marijuana    Comment: occ    ROS   Objective:   Vitals: BP 122/82 (BP Location: Right Arm)    Pulse 68    Temp 98 F (36.7 C) (Oral)    Resp 18    LMP 03/13/2021 (Exact Date)    SpO2 99%   Physical Exam Constitutional:      General: She  is not in acute distress.    Appearance: Normal appearance. She is well-developed. She is not ill-appearing, toxic-appearing or diaphoretic.  HENT:     Head: Normocephalic and atraumatic.     Nose: Nose normal.     Mouth/Throat:     Mouth: Mucous membranes are moist.     Pharynx: Oropharynx is clear.  Eyes:     General: No scleral icterus.       Right eye: No discharge.        Left eye: No discharge.     Extraocular Movements: Extraocular movements intact.     Conjunctiva/sclera: Conjunctivae normal.     Pupils: Pupils are equal, round, and reactive to light.  Cardiovascular:     Rate and Rhythm: Normal rate.  Pulmonary:     Effort: Pulmonary effort is normal.  Skin:    General: Skin is warm and dry.  Neurological:     General: No focal deficit present.     Mental Status: She is alert and oriented to person, place, and time.  Psychiatric:        Mood and Affect: Mood normal.        Behavior: Behavior normal.        Thought Content: Thought content normal.        Judgment: Judgment normal.    Results for orders  placed or performed during the hospital encounter of 03/21/21 (from the past 24 hour(s))  POCT urine pregnancy     Status: None   Collection Time: 03/21/21  5:09 PM  Result Value Ref Range   Preg Test, Ur Negative Negative   Assessment and Plan :   PDMP not reviewed this encounter.  1. Bacterial vaginosis   2. Yeast vaginitis   3. Vaginal discharge   4. Unprotected sex    STI check pending.  We will treat empirically for recurrent bacterial vaginosis, yeast vaginitis.  Prescription sent to the pharmacy for metronidazole and fluconazole.  We will treat as appropriate otherwise based off of results.  Encouraged patient to continue efforts at establishing care with a gynecologist in the near future. Counseled patient on potential for adverse effects with medications prescribed/recommended today, ER and return-to-clinic precautions discussed, patient verbalized  understanding.    Wallis Bamberg, PA-C 03/21/21 1739

## 2021-03-21 NOTE — ED Triage Notes (Signed)
Wants STD check.  Milky discharge and pain x 1 week.  States she wants blood work completed this time.

## 2021-03-22 LAB — CERVICOVAGINAL ANCILLARY ONLY
Bacterial Vaginitis (gardnerella): NEGATIVE
Candida Glabrata: NEGATIVE
Candida Vaginitis: POSITIVE — AB
Chlamydia: NEGATIVE
Comment: NEGATIVE
Comment: NEGATIVE
Comment: NEGATIVE
Comment: NEGATIVE
Comment: NEGATIVE
Comment: NORMAL
Neisseria Gonorrhea: NEGATIVE
Trichomonas: NEGATIVE

## 2021-03-22 LAB — RPR: RPR Ser Ql: NONREACTIVE

## 2021-03-22 LAB — HIV ANTIBODY (ROUTINE TESTING W REFLEX): HIV Screen 4th Generation wRfx: NONREACTIVE

## 2021-04-04 ENCOUNTER — Ambulatory Visit
Admission: RE | Admit: 2021-04-04 | Discharge: 2021-04-04 | Disposition: A | Discharge status: Home / Self Care | Payer: Medicaid Other | Source: Ambulatory Visit | Attending: Emergency Medicine | Admitting: Emergency Medicine

## 2021-04-04 ENCOUNTER — Other Ambulatory Visit: Payer: Self-pay

## 2021-04-04 VITALS — BP 137/89 | HR 74 | Temp 99.2°F | Resp 18

## 2021-04-04 DIAGNOSIS — N898 Other specified noninflammatory disorders of vagina: Secondary | ICD-10-CM

## 2021-04-04 DIAGNOSIS — N760 Acute vaginitis: Secondary | ICD-10-CM | POA: Insufficient documentation

## 2021-04-04 MED ORDER — FLUCONAZOLE 150 MG PO TABS: 150.0000 mg | ORAL_TABLET | Freq: Once | ORAL | 1 refills | Status: AC

## 2021-04-04 NOTE — ED Triage Notes (Signed)
Pt reports vaginal itching and clear vaginal discharge x 1 week. States she used  a different soap before the complaints.

## 2021-04-04 NOTE — ED Provider Notes (Signed)
HPI  SUBJECTIVE:  Shelly Mccann is a 25 y.o. female who presents with 6 days of vaginal itching, nonodorous thick, yellow cottage cheeselike vaginal discharge that has now become clear and watery today.  Symptoms started after using a new soap.  No vaginal odor, rash, blisters, vulvar swelling, nausea, vomiting, fevers, abdominal, back, pelvic pain, urinary complaints.  She tried douching which made her symptoms worse.  No alleviating factors.  She has 1 female sexual partner, she is not sure if he has any other partners.  STDs are a concern today.  She has a past medical history of trichomonas, recurrent BV and yeast.  She has been seen 9 times since May 22 for vaginitis, and is frequently positive only for BV and yeast.  She had trichomonas once.  No other history of STDs, diabetes.  LMP: 12/7.  Denies possibility of being pregnant.  PMD: None.  Last evaluation was on 12/15, where she was positive for yeast.  HIV, RPR were negative.  She was sent home with Flagyl, states that she took 2 doses, and then discontinued it per advice of lab nurse since BV testing was negative.   History reviewed. No pertinent past medical history.  Past Surgical History:  Procedure Laterality Date   TONSILLECTOMY N/A 10/03/2013   Procedure: TONSILLECTOMY;  Surgeon: Serena Colonel, MD;  Location: [facility];  Service: ENT;  Laterality: N/A;    Family History  Problem Relation Age of Onset   Alcohol abuse Paternal Grandfather    Hypertension Paternal Grandfather     Social History   Tobacco Use   Smoking status: Never   Smokeless tobacco: Never  Vaping Use   Vaping Use: Never used  Substance Use Topics   Alcohol use: Yes   Drug use: Yes    Types: Marijuana    Comment: occ    No current facility-administered medications for this encounter.  Current Outpatient Medications:    fluconazole (DIFLUCAN) 150 MG tablet, Take 1 tablet (150 mg total) by mouth once a week., Disp: 2 tablet, Rfl: 0    metroNIDAZOLE (FLAGYL) 500 MG tablet, Take 1 tablet (500 mg total) by mouth 2 (two) times daily with a meal. DO NOT CONSUME ALCOHOL WHILE TAKING THIS MEDICATION., Disp: 14 tablet, Rfl: 0  No Known Allergies   ROS  As noted in HPI.   Physical Exam  BP 137/89 (BP Location: Right Arm)    Pulse 74    Temp 99.2 F (37.3 C) (Oral)    Resp 18    LMP 03/13/2021 (Exact Date)    SpO2 99%   Constitutional: Well developed, well nourished, no acute distress Eyes:  EOMI, conjunctiva normal bilaterally HENT: Normocephalic, atraumatic,mucus membranes moist Respiratory: Normal inspiratory effort Cardiovascular: Normal rate GI: nondistended soft, nontender. No suprapubic tenderness  back: No CVA tenderness GU: Deferred skin: No rash, skin intact Musculoskeletal: no deformities Neurologic: Alert & oriented x 3, no focal neuro deficits Psychiatric: Speech and behavior appropriate   ED Course   Medications - No data to display  No orders of the defined types were placed in this encounter.   No results found for this or any previous visit (from the past 24 hour(s)). No results found.  ED Clinical Impression  1. Vaginal discharge     ED Assessment/Plan  H&P most c/w yeast infection versus BV.  Sending off swab testing for gonorrhea, chlamydia, trichomonas, BV and yeast.  Patient states she had negative recent HIV and RPR testing.  Will  not treat empirically now. Will send home with diflucan for yeast infection.  She will restart her recent leftover prescription of Flagyl and will discontinue it if BV comes back negative.  She will call here and let us know if her BV is positive, and we will need to extend her Flagyl prescription.  She will let us know how much we need to extended by.  Follow-up with [facility] or with [facility]  [phone]   pt provided working phone number. Discussed labs, MDM, plan and followup with patient. Pt agrees with plan.   No orders of the  defined types were placed in this encounter.   *This clinic note was created using Dragon dictation software. Therefore, there may be occasional mistakes despite careful proofreading.  ?     Domenick Gong, MD 04/05/21 1156

## 2021-04-04 NOTE — Discharge Instructions (Signed)
Take the medication as we discussed.  Discontinue the Flagyl if BV is negative.  We will be happy to extend your Flagyl prescription if your BV comes back positive.  Refrain from sexual contact until all of your labs have come back, symptoms have resolved, and your partner(s) are treated if necessary.   Go to www.goodrx.com  or www.costplusdrugs.com to look up your medications. This will give you a list of where you can find your prescriptions at the most affordable prices. Or ask the pharmacist what the cash price is, or if they have any other discount programs available to help make your medication more affordable. This can be less expensive than what you would pay with insurance.

## 2021-04-05 LAB — CERVICOVAGINAL ANCILLARY ONLY
Bacterial Vaginitis (gardnerella): POSITIVE — AB
Candida Glabrata: NEGATIVE
Candida Vaginitis: POSITIVE — AB
Chlamydia: NEGATIVE
Comment: NEGATIVE
Comment: NEGATIVE
Comment: NEGATIVE
Comment: NEGATIVE
Comment: NEGATIVE
Comment: NORMAL
Neisseria Gonorrhea: NEGATIVE
Trichomonas: NEGATIVE

## 2021-04-08 ENCOUNTER — Telehealth: Payer: Self-pay | Admitting: Emergency Medicine

## 2021-04-09 ENCOUNTER — Telehealth (HOSPITAL_COMMUNITY): Payer: Self-pay

## 2021-04-09 MED ORDER — METRONIDAZOLE 500 MG PO TABS: 500.0000 mg | ORAL_TABLET | Freq: Two times a day (BID) | ORAL | 0 refills | Status: DC

## 2021-04-24 ENCOUNTER — Ambulatory Visit (HOSPITAL_COMMUNITY): Payer: Self-pay

## 2021-04-25 ENCOUNTER — Other Ambulatory Visit: Payer: Self-pay

## 2021-04-25 ENCOUNTER — Ambulatory Visit (HOSPITAL_COMMUNITY)
Admission: RE | Admit: 2021-04-25 | Discharge: 2021-04-25 | Disposition: A | Discharge status: Home / Self Care | Payer: Self-pay | Source: Ambulatory Visit | Attending: Family Medicine | Admitting: Family Medicine

## 2021-04-25 ENCOUNTER — Encounter (HOSPITAL_COMMUNITY): Payer: Self-pay

## 2021-04-25 VITALS — BP 112/76 | HR 68 | Temp 98.9°F | Resp 17

## 2021-04-25 DIAGNOSIS — N761 Subacute and chronic vaginitis: Secondary | ICD-10-CM | POA: Insufficient documentation

## 2021-04-25 DIAGNOSIS — N898 Other specified noninflammatory disorders of vagina: Secondary | ICD-10-CM | POA: Insufficient documentation

## 2021-04-25 MED ORDER — METRONIDAZOLE 500 MG PO TABS: 500.0000 mg | ORAL_TABLET | Freq: Two times a day (BID) | ORAL | 0 refills | Status: DC

## 2021-04-25 MED ORDER — FLUCONAZOLE 150 MG PO TABS: 150.0000 mg | ORAL_TABLET | Freq: Every day | ORAL | 0 refills | Status: DC

## 2021-04-25 NOTE — ED Provider Notes (Signed)
[facility]    CSN: [id number] Arrival date & time: 04/25/21  1700      History   Chief Complaint Chief Complaint  Patient presents with   vaginal odor   Vaginal Discharge    HPI Shelly Mccann is a 26 y.o. female.   HPI Patient presents today with vaginal itching and irritation for several days. She has a history of recurrent Bacterial Vaginosis and most recently yeast. Reports taking a home pregnancy test following her last office visit at urgent care which was positive. She is considering terminating pregnancy and is scheduled for a consult in 2 days. Reports copious amounts of discharge, odor, and itching (persistent). No changes in partners.  History reviewed. No pertinent past medical history.  Patient Active Problem List   Diagnosis Date Noted   S/P tonsillectomy 10/03/2013    Past Surgical History:  Procedure Laterality Date   TONSILLECTOMY N/A 10/03/2013   Procedure: TONSILLECTOMY;  Surgeon: Izora Gala, MD;  Location: [facility];  Service: ENT;  Laterality: N/A;    OB History     Gravida  3   Para      Term      Preterm      AB  2   Living         SAB  1   IAB  1   Ectopic      Multiple      Live Births               Home Medications    Prior to Admission medications   Medication Sig Start Date End Date Taking? Authorizing Provider  fluconazole (DIFLUCAN) 150 MG tablet Take 1 tablet (150 mg total) by mouth daily. 04/25/21  Yes Scot Jun, FNP  metroNIDAZOLE (FLAGYL) 500 MG tablet Take 1 tablet (500 mg total) by mouth 2 (two) times daily with a meal. DO NOT CONSUME ALCOHOL WHILE TAKING THIS MEDICATION. 04/25/21   Scot Jun, FNP  promethazine (PHENERGAN) 25 MG suppository Place 1 suppository (25 mg total) rectally every 6 (six) hours as needed for nausea or vomiting. Patient not taking: Reported on 06/16/2019 10/03/13 11/07/19  Izora Gala, MD    Family History Family History  Problem  Relation Age of Onset   Alcohol abuse Paternal Grandfather    Hypertension Paternal Grandfather     Social History Social History   Tobacco Use   Smoking status: Never   Smokeless tobacco: Never  Vaping Use   Vaping Use: Never used  Substance Use Topics   Alcohol use: Yes   Drug use: Yes    Types: Marijuana    Comment: occ     Allergies   Patient has no known allergies.   Review of Systems Review of Systems Pertinent negatives listed in HPI  Physical Exam Triage Vital Signs ED Triage Vitals  Enc Vitals Group     BP 04/25/21 1802 112/76     Pulse Rate 04/25/21 1802 68     Resp 04/25/21 1802 17     Temp 04/25/21 1802 98.9 F (37.2 C)     Temp Source 04/25/21 1802 Oral     SpO2 04/25/21 1802 97 %     Weight --      Height --      Head Circumference --      Peak Flow --      Pain Score 04/25/21 1801 0     Pain Loc --  Pain Edu? --      Excl. in [place]? --    No data found.  Updated Vital Signs BP 112/76 (BP Location: Left Arm)    Pulse 68    Temp 98.9 F (37.2 C) (Oral)    Resp 17    LMP 03/13/2021 (Exact Date)    SpO2 97%   Visual Acuity Right Eye Distance:   Left Eye Distance:   Bilateral Distance:    Right Eye Near:   Left Eye Near:    Bilateral Near:     Physical Exam General appearance: Alert, well developed, well nourished, cooperative Head: Normocephalic, without obvious abnormality, atraumatic Respiratory: Respirations even and unlabored, normal respiratory rate Heart: Rate and rhythm normal.  Skin: Skin color, texture, turgor normal. No rashes seen  Psych: Appropriate mood and affect. Neurologic: no obvious focal neurological abnormalities Vaginal cytology pending UC Treatments / Results  Labs (all labs ordered are listed, but only abnormal results are displayed) Labs Reviewed  CERVICOVAGINAL ANCILLARY ONLY    EKG   Radiology No results found.  Procedures Procedures (including critical care time)  Medications Ordered in  UC Medications - No data to display  Initial Impression / Assessment and Plan / UC Course  I have reviewed the triage vital signs and the nursing notes.  Pertinent labs & imaging results that were available during my care of the patient were reviewed by me and considered in my medical decision making (see chart for details).    Vaginitis, recurrent Treatment with fluconazole and metronidazole  Cytology pending. RTC PRN Final Clinical Impressions(s) / UC Diagnoses   Final diagnoses:  Vaginal discharge  Subacute vaginitis   Discharge Instructions   None    ED Prescriptions     Medication Sig Dispense Auth. Provider   metroNIDAZOLE (FLAGYL) 500 MG tablet Take 1 tablet (500 mg total) by mouth 2 (two) times daily with a meal. DO NOT CONSUME ALCOHOL WHILE TAKING THIS MEDICATION. 14 tablet Scot Jun, FNP   fluconazole (DIFLUCAN) 150 MG tablet Take 1 tablet (150 mg total) by mouth daily. 2 tablet Scot Jun, FNP      PDMP not reviewed this encounter.   Scot Jun, FNP 04/25/21 1826

## 2021-04-25 NOTE — ED Triage Notes (Signed)
Pt presents wit c/o vaginal discharge and odor x 4 days.

## 2021-04-26 LAB — CERVICOVAGINAL ANCILLARY ONLY
Bacterial Vaginitis (gardnerella): POSITIVE — AB
Candida Glabrata: NEGATIVE
Candida Vaginitis: POSITIVE — AB
Chlamydia: NEGATIVE
Comment: NEGATIVE
Comment: NEGATIVE
Comment: NEGATIVE
Comment: NEGATIVE
Comment: NEGATIVE
Comment: NORMAL
Neisseria Gonorrhea: NEGATIVE
Trichomonas: NEGATIVE

## 2021-05-28 ENCOUNTER — Ambulatory Visit (HOSPITAL_COMMUNITY): Payer: Self-pay

## 2021-05-29 ENCOUNTER — Ambulatory Visit (HOSPITAL_COMMUNITY): Payer: Self-pay

## 2021-06-03 ENCOUNTER — Encounter (HOSPITAL_COMMUNITY): Payer: Self-pay

## 2021-06-03 ENCOUNTER — Other Ambulatory Visit: Payer: Self-pay

## 2021-06-03 ENCOUNTER — Ambulatory Visit (HOSPITAL_COMMUNITY)
Admission: RE | Admit: 2021-06-03 | Discharge: 2021-06-03 | Disposition: A | Discharge status: Home / Self Care | Payer: Self-pay | Source: Ambulatory Visit | Attending: Internal Medicine | Admitting: Internal Medicine

## 2021-06-03 VITALS — BP 110/76 | HR 72 | Temp 98.4°F | Resp 16 | Ht 69.0 in | Wt 210.1 lb

## 2021-06-03 DIAGNOSIS — N760 Acute vaginitis: Secondary | ICD-10-CM | POA: Insufficient documentation

## 2021-06-03 MED ORDER — METRONIDAZOLE 500 MG PO TABS: 500.0000 mg | ORAL_TABLET | Freq: Two times a day (BID) | ORAL | 0 refills | Status: AC

## 2021-06-03 MED ORDER — FLUCONAZOLE 150 MG PO TABS: 150.0000 mg | ORAL_TABLET | Freq: Every day | ORAL | 0 refills | Status: DC

## 2021-06-03 NOTE — Discharge Instructions (Addendum)
Please use medications as prescribed Use mild soap You may be sensitive to some of the female hygiene products We will call you with recommendations if labs are abnormal Please use boric acid vaginal suppositories to help with your symptoms.

## 2021-06-03 NOTE — ED Triage Notes (Addendum)
Pt reports vaginal discharge and discomfort x 1 week. States she gets frequent yeast infections.

## 2021-06-04 ENCOUNTER — Telehealth (HOSPITAL_COMMUNITY): Payer: Self-pay | Admitting: Emergency Medicine

## 2021-06-04 LAB — CERVICOVAGINAL ANCILLARY ONLY
Bacterial Vaginitis (gardnerella): POSITIVE — AB
Candida Glabrata: NEGATIVE
Candida Vaginitis: POSITIVE — AB
Comment: NEGATIVE
Comment: NEGATIVE
Comment: NEGATIVE

## 2021-06-04 NOTE — Telephone Encounter (Signed)
Opened in error

## 2021-06-05 NOTE — ED Provider Notes (Signed)
[facility]    CSN: [id number] Arrival date & time: 06/03/21  1446      History   Chief Complaint Chief Complaint  Patient presents with   xxx    HPI Shelly Mccann is a 26 y.o. female comes to urgent care with 1 week history of thin vaginal discharge with some discomfort.  No abdominal pain.  No dysuria urgency or frequency.  Patient has a history of recurrent bacterial vaginosis and yeast infection.  She has been using vaginal washes and changing bathing soaps.  She recently had medically induced abortion 4 weeks ago.  No bleeding.Marland Kitchen   HPI  History reviewed. No pertinent past medical history.  Patient Active Problem List   Diagnosis Date Noted   S/P tonsillectomy 10/03/2013    Past Surgical History:  Procedure Laterality Date   TONSILLECTOMY N/A 10/03/2013   Procedure: TONSILLECTOMY;  Surgeon: Serena Colonel, MD;  Location: [facility];  Service: ENT;  Laterality: N/A;    OB History     Gravida  3   Para      Term      Preterm      AB  2   Living         SAB  1   IAB  1   Ectopic      Multiple      Live Births               Home Medications    Prior to Admission medications   Medication Sig Start Date End Date Taking? Authorizing Provider  fluconazole (DIFLUCAN) 150 MG tablet Take 1 tablet (150 mg total) by mouth daily. 06/03/21   Yovany Clock, Britta Mccreedy, MD  metroNIDAZOLE (FLAGYL) 500 MG tablet Take 1 tablet (500 mg total) by mouth 2 (two) times daily with a meal for 7 days. DO NOT CONSUME ALCOHOL WHILE TAKING THIS MEDICATION. 06/03/21 06/10/21  [name], MD  promethazine (PHENERGAN) 25 MG suppository Place 1 suppository (25 mg total) rectally every 6 (six) hours as needed for nausea or vomiting. Patient not taking: Reported on 06/16/2019 10/03/13 11/07/19  Serena Colonel, MD    Family History Family History  Problem Relation Age of Onset   Healthy Mother    Healthy Father    Alcohol abuse Paternal Grandfather     Hypertension Paternal Grandfather     Social History Social History   Tobacco Use   Smoking status: Never   Smokeless tobacco: Never  Vaping Use   Vaping Use: Never used  Substance Use Topics   Alcohol use: Yes   Drug use: Yes    Types: Marijuana    Comment: occ     Allergies   Patient has no known allergies.   Review of Systems Review of Systems  Gastrointestinal: Negative.  Negative for abdominal pain, diarrhea and vomiting.  Genitourinary:  Positive for vaginal discharge. Negative for dysuria, frequency, urgency, vaginal bleeding and vaginal pain.    Physical Exam Triage Vital Signs ED Triage Vitals  Enc Vitals Group     BP 06/03/21 1520 110/76     Pulse Rate 06/03/21 1520 72     Resp 06/03/21 1520 16     Temp 06/03/21 1520 98.4 F (36.9 C)     Temp Source 06/03/21 1520 Oral     SpO2 06/03/21 1520 98 %     Weight 06/03/21 1519 210 lb 1.6 oz (95.3 kg)     Height 06/03/21 1519 5\' 9"  (1.753  m)     Head Circumference --      Peak Flow --      Pain Score 06/03/21 1519 0     Pain Loc --      Pain Edu? --      Excl. in GC? --    No data found.  Updated Vital Signs BP 110/76 (BP Location: Left Arm)    Pulse 72    Temp 98.4 F (36.9 C) (Oral)    Resp 16    Ht 5\' 9"  (1.753 m)    Wt 95.3 kg    LMP 03/13/2021 (Exact Date)    SpO2 98%    Breastfeeding Unknown    BMI 31.03 kg/m   Visual Acuity Right Eye Distance:   Left Eye Distance:   Bilateral Distance:    Right Eye Near:   Left Eye Near:    Bilateral Near:     Physical Exam Vitals and nursing note reviewed.  Constitutional:      General: She is not in acute distress.    Appearance: She is not ill-appearing.  Cardiovascular:     Rate and Rhythm: Normal rate and regular rhythm.  Pulmonary:     Effort: Pulmonary effort is normal.     Breath sounds: Normal breath sounds.  Abdominal:     General: Bowel sounds are normal.     Palpations: Abdomen is soft.     Tenderness: There is no abdominal tenderness.  There is no guarding or rebound.  Musculoskeletal:        General: Normal range of motion.  Neurological:     Mental Status: She is alert.     UC Treatments / Results  Labs (all labs ordered are listed, but only abnormal results are displayed) Labs Reviewed  CERVICOVAGINAL ANCILLARY ONLY - Abnormal; Notable for the following components:      Result Value   Bacterial Vaginitis (gardnerella) Positive (*)    Candida Vaginitis Positive (*)    All other components within normal limits    EKG   Radiology No results found.  Procedures Procedures (including critical care time)  Medications Ordered in UC Medications - No data to display  Initial Impression / Assessment and Plan / UC Course  I have reviewed the triage vital signs and the nursing notes.  Pertinent labs & imaging results that were available during my care of the patient were reviewed by me and considered in my medical decision making (see chart for details).     1.  Acute vaginitis: Cervical vaginal swab for BV/vaginal yeast Fluconazole 150 mg x 1 dose to be repeated in 72 hours Flagyl 500 mg twice daily for 7 days Boric acid vaginal suppositories 600 mg for 14 to 21 days Patient is advised to avoid using vaginal washes or douching She has an appointment with a gynecologist for further management Return to urgent care if symptoms worsen We will call patient with recommendations if labs are abnormal Final Clinical Impressions(s) / UC Diagnoses   Final diagnoses:  Acute vaginitis     Discharge Instructions      Please use medications as prescribed Use mild soap You may be sensitive to some of the female hygiene products We will call you with recommendations if labs are abnormal Please use boric acid vaginal suppositories to help with your symptoms.    ED Prescriptions     Medication Sig Dispense Auth. Provider   fluconazole (DIFLUCAN) 150 MG tablet Take 1 tablet (150 mg total) by  mouth daily. 2  tablet Anh Mangano, Britta Mccreedy, MD   metroNIDAZOLE (FLAGYL) 500 MG tablet Take 1 tablet (500 mg total) by mouth 2 (two) times daily with a meal for 7 days. DO NOT CONSUME ALCOHOL WHILE TAKING THIS MEDICATION. 14 tablet Bonney Berres, Britta Mccreedy, MD      PDMP not reviewed this encounter.   [name], MD 06/05/21 [phone]

## 2021-07-16 ENCOUNTER — Ambulatory Visit (HOSPITAL_COMMUNITY): Payer: Self-pay

## 2021-07-17 ENCOUNTER — Ambulatory Visit (HOSPITAL_COMMUNITY): Payer: Self-pay

## 2021-07-18 ENCOUNTER — Encounter (HOSPITAL_COMMUNITY): Payer: Self-pay

## 2021-07-18 ENCOUNTER — Other Ambulatory Visit: Payer: Self-pay

## 2021-07-18 ENCOUNTER — Ambulatory Visit (HOSPITAL_COMMUNITY)
Admission: RE | Admit: 2021-07-18 | Discharge: 2021-07-18 | Disposition: A | Discharge status: Home / Self Care | Payer: Medicaid Other | Source: Ambulatory Visit | Attending: Physician Assistant | Admitting: Physician Assistant

## 2021-07-18 VITALS — BP 115/78 | HR 87 | Temp 98.1°F | Resp 18

## 2021-07-18 DIAGNOSIS — N760 Acute vaginitis: Secondary | ICD-10-CM

## 2021-07-18 DIAGNOSIS — N898 Other specified noninflammatory disorders of vagina: Secondary | ICD-10-CM

## 2021-07-18 MED ORDER — FLUCONAZOLE 150 MG PO TABS: 150.0000 mg | ORAL_TABLET | ORAL | 0 refills | Status: DC | PRN

## 2021-07-18 MED ORDER — METRONIDAZOLE 500 MG PO TABS
500.0000 mg | ORAL_TABLET | Freq: Two times a day (BID) | ORAL | 0 refills | Status: DC
Start: 2021-07-18 — End: 2021-08-24

## 2021-07-18 NOTE — Discharge Instructions (Signed)
I have sent to the treatment for bacterial vaginosis and yeast.  We will contact you with your lab works if we need to arrange additional treatment.  Wear loosefitting cotton underwear and use hypoallergenic soaps and detergents.  You should abstain from sex until you receive your results.  It is important to use a condom with each sexual encounter.  If you develop any symptoms including pelvic pain, fever, nausea, vomiting you need to be seen immediately. ?

## 2021-07-18 NOTE — ED Provider Notes (Signed)
?[facility] ? ? ? ?CSN: [id number] ?Arrival date & time: 07/18/21  1546 ? ? ?  ? ?History   ?Chief Complaint ?Chief Complaint  ?Patient presents with  ? Vaginal Discharge  ?  Yeast infection/ BV/ STD screening. - Entered by patient  ? Appointment  ?  4:00  ? ? ?HPI ?Shelly Mccann ReasonsJalen K Mccann is a 26 y.o. female.  ? ?Patient presents today with a several day history of vaginal irritation.  She reports pruritus, discharge, mild odor.  She has a history of recurrent yeast infections and bacterial vaginosis and states current symptoms are similar to previous episodes of this condition.  She was last treated after testing positive for both BV and yeast 06/03/2021.  She does report changing her soap and wonders if this could be contributing to symptoms.  She is sexually active with 1 female partner currently.  She denies any pelvic pain, abdominal pain, fever, nausea, vomiting.  She is confident that she is not pregnant.  Denies any recent medication changes or antibiotic use. ? ? ?History reviewed. No pertinent past medical history. ? ?Patient Active Problem List  ? Diagnosis Date Noted  ? S/P tonsillectomy 10/03/2013  ? ? ?Past Surgical History:  ?Procedure Laterality Date  ? TONSILLECTOMY N/A 10/03/2013  ? Procedure: TONSILLECTOMY;  Surgeon: Serena ColonelJefry Rosen, MD;  Location: [facility];  Service: ENT;  Laterality: N/A;  ? ? ?OB History   ? ? Gravida  ?3  ? Para  ?   ? Term  ?   ? Preterm  ?   ? AB  ?2  ? Living  ?   ?  ? ? SAB  ?1  ? IAB  ?1  ? Ectopic  ?   ? Multiple  ?   ? Live Births  ?   ?   ?  ?  ? ? ? ?Home Medications   ? ?Prior to Admission medications   ?Medication Sig Start Date End Date Taking? Authorizing Provider  ?metroNIDAZOLE (FLAGYL) 500 MG tablet Take 1 tablet (500 mg total) by mouth 2 (two) times daily. 07/18/21  Yes Shalisa Mcquade K, PA-C  ?fluconazole (DIFLUCAN) 150 MG tablet Take 1 tablet (150 mg total) by mouth every 3 (three) days as needed. 07/18/21   Sheppard Luckenbach, Noberto RetortErin K, PA-C  ?promethazine  (PHENERGAN) 25 MG suppository Place 1 suppository (25 mg total) rectally every 6 (six) hours as needed for nausea or vomiting. ?Patient not taking: Reported on 06/16/2019 10/03/13 11/07/19  Serena Colonelosen, Jefry, MD  ? ? ?Family History ?Family History  ?Problem Relation Age of Onset  ? Healthy Mother   ? Healthy Father   ? Alcohol abuse Paternal Grandfather   ? Hypertension Paternal Grandfather   ? ? ?Social History ?Social History  ? ?Tobacco Use  ? Smoking status: Never  ? Smokeless tobacco: Never  ?Vaping Use  ? Vaping Use: Every day  ?Substance Use Topics  ? Alcohol use: Yes  ? Drug use: Yes  ?  Types: Marijuana  ?  Comment: occ  ? ? ? ?Allergies   ?Patient has no known allergies. ? ? ?Review of Systems ?Review of Systems  ?Constitutional:  Negative for activity change, appetite change, fatigue and fever.  ?Respiratory:  Negative for cough and shortness of breath.   ?Cardiovascular:  Negative for chest pain.  ?Gastrointestinal:  Negative for abdominal pain, diarrhea, nausea and vomiting.  ?Genitourinary:  Positive for vaginal discharge and vaginal pain (Irritation). Negative for dysuria, frequency, pelvic pain, urgency  and vaginal bleeding.  ? ? ?Physical Exam ?Triage Vital Signs ?ED Triage Vitals [07/18/21 1602]  ?Enc Vitals Group  ?   BP 115/78  ?   Pulse Rate 87  ?   Resp 18  ?   Temp 98.1 ?F (36.7 ?C)  ?   Temp Source Oral  ?   SpO2 97 %  ?   Weight   ?   Height   ?   Head Circumference   ?   Peak Flow   ?   Pain Score   ?   Pain Loc   ?   Pain Edu?   ?   Excl. in GC?   ? ?No data found. ? ?Updated Vital Signs ?BP 115/78 (BP Location: Right Arm)   Pulse 87   Temp 98.1 ?F (36.7 ?C) (Oral)   Resp 18   LMP 07/02/2021   SpO2 97%  ? ?Visual Acuity ?Right Eye Distance:   ?Left Eye Distance:   ?Bilateral Distance:   ? ?Right Eye Near:   ?Left Eye Near:    ?Bilateral Near:    ? ?Physical Exam ?Vitals reviewed.  ?Constitutional:   ?   General: She is awake. She is not in acute distress. ?   Appearance: Normal appearance.  She is well-developed. She is not ill-appearing.  ?   Comments: Very pleasant female appears stated age in no acute distress sitting comfortably in exam room  ?HENT:  ?   Head: Normocephalic and atraumatic.  ?Cardiovascular:  ?   Rate and Rhythm: Normal rate and regular rhythm.  ?   Heart sounds: Normal heart sounds, S1 normal and S2 normal. No murmur heard. ?Pulmonary:  ?   Effort: Pulmonary effort is normal.  ?   Breath sounds: Normal breath sounds. No wheezing, rhonchi or rales.  ?   Comments: Clear to auscultation bilaterally ?Abdominal:  ?   General: Bowel sounds are normal.  ?   Palpations: Abdomen is soft.  ?   Tenderness: There is no abdominal tenderness. There is no right CVA tenderness, left CVA tenderness, guarding or rebound.  ?   Comments: Benign abdominal exam  ?Genitourinary: ?   Comments: Exam deferred ?Psychiatric:     ?   Behavior: Behavior is cooperative.  ? ? ? ?UC Treatments / Results  ?Labs ?(all labs ordered are listed, but only abnormal results are displayed) ?Labs Reviewed  ?CERVICOVAGINAL ANCILLARY ONLY  ? ? ?EKG ? ? ?Radiology ?No results found. ? ?Procedures ?Procedures (including critical care time) ? ?Medications Ordered in UC ?Medications - No data to display ? ?Initial Impression / Assessment and Plan / UC Course  ?I have reviewed the triage vital signs and the nursing notes. ? ?Pertinent labs & imaging results that were available during my care of the patient were reviewed by me and considered in my medical decision making (see chart for details). ? ?  ? ?Empirically treat for bacterial vaginosis and yeast given history of recurrent infections.  STI swab was collected today-results pending.  We will contact patient if any additional treatment is required.  Recommended loosefitting cotton underwear and using hypoallergenic soaps and detergents.  She is to abstain from sex until results are available.  Discussed the importance of safe sex practices.  Discussed that if she develops any  symptoms including abdominal pain, pelvic pain, fever, nausea, vomiting she needs to return immediately for reevaluation. ? ?Final Clinical Impressions(s) / UC Diagnoses  ? ?Final diagnoses:  ?Vaginal discharge  ?Acute  vaginitis  ? ? ? ?Discharge Instructions   ? ?  ?I have sent to the treatment for bacterial vaginosis and yeast.  We will contact you with your lab works if we need to arrange additional treatment.  Wear loosefitting cotton underwear and use hypoallergenic soaps and detergents.  You should abstain from sex until you receive your results.  It is important to use a condom with each sexual encounter.  If you develop any symptoms including pelvic pain, fever, nausea, vomiting you need to be seen immediately. ? ? ? ? ?ED Prescriptions   ? ? Medication Sig Dispense Auth. Provider  ? fluconazole (DIFLUCAN) 150 MG tablet Take 1 tablet (150 mg total) by mouth every 3 (three) days as needed. 2 tablet Arantxa Piercey K, PA-C  ? metroNIDAZOLE (FLAGYL) 500 MG tablet Take 1 tablet (500 mg total) by mouth 2 (two) times daily. 14 tablet Natsumi Whitsitt K, PA-C  ? ?  ? ?PDMP not reviewed this encounter. ?  ?Jeani Hawking, PA-C ?07/18/21 1615 ? ?

## 2021-07-18 NOTE — ED Triage Notes (Signed)
Patient is having vaginal discharge, and vaginal odor, slight itchiness.  Patient hinks this is a yeast infection ?

## 2021-07-19 LAB — CERVICOVAGINAL ANCILLARY ONLY
Bacterial Vaginitis (gardnerella): NEGATIVE
Candida Glabrata: NEGATIVE
Candida Vaginitis: POSITIVE — AB
Chlamydia: NEGATIVE
Comment: NEGATIVE
Comment: NEGATIVE
Comment: NEGATIVE
Comment: NEGATIVE
Comment: NEGATIVE
Comment: NORMAL
Neisseria Gonorrhea: NEGATIVE
Trichomonas: NEGATIVE

## 2021-08-24 ENCOUNTER — Ambulatory Visit
Admission: RE | Admit: 2021-08-24 | Discharge: 2021-08-24 | Disposition: A | Discharge status: Home / Self Care | Payer: Medicaid Other | Source: Ambulatory Visit | Attending: Emergency Medicine | Admitting: Emergency Medicine

## 2021-08-24 VITALS — HR 81 | Temp 98.6°F | Resp 18

## 2021-08-24 DIAGNOSIS — N760 Acute vaginitis: Secondary | ICD-10-CM | POA: Insufficient documentation

## 2021-08-24 LAB — POCT URINE PREGNANCY: Preg Test, Ur: NEGATIVE

## 2021-08-24 LAB — POCT URINALYSIS DIP (MANUAL ENTRY)
Bilirubin, UA: NEGATIVE
Blood, UA: NEGATIVE
Glucose, UA: NEGATIVE mg/dL
Ketones, POC UA: NEGATIVE mg/dL
Nitrite, UA: NEGATIVE
Protein Ur, POC: 30 mg/dL — AB
Spec Grav, UA: 1.025 (ref 1.010–1.025)
Urobilinogen, UA: 0.2 E.U./dL
pH, UA: 7 (ref 5.0–8.0)

## 2021-08-24 MED ORDER — FLUCONAZOLE 150 MG PO TABS: ORAL_TABLET | ORAL | 0 refills | Status: DC

## 2021-08-24 MED ORDER — METRONIDAZOLE 0.75 % VA GEL: VAGINAL | 1 refills | Status: DC

## 2021-08-24 MED ORDER — METRONIDAZOLE 500 MG PO TABS: 500.0000 mg | ORAL_TABLET | Freq: Two times a day (BID) | ORAL | 0 refills | Status: AC

## 2021-08-24 NOTE — ED Provider Notes (Signed)
30 to    [facility]    CSN: [id number] Arrival date & time: 08/24/21  1240    HISTORY   Chief Complaint  Patient presents with   Vaginal Itching    Yeast infection. - Entered by patient   HPI Shelly Mccann is a 26 y.o. female. Pt here with vaginal itching and odor x 1 week. Prone to yeast infections, but would like UA, Upreg and full swab for STIs just in case.  Patient states she does not like to use metronidazole vaginal gel because she feels like it begins to overflow.  Has appointment with you via next month.  Patient states she did not feel like she could wait that long be seen.  States she gets a yeast infection and a BV infection every several months.  Patient states she is usually prescribed the double dose of Diflucan but feels like it just comes right back again.  The history is provided by the patient.  History reviewed. No pertinent past medical history. Patient Active Problem List   Diagnosis Date Noted   S/P tonsillectomy 10/03/2013   Past Surgical History:  Procedure Laterality Date   TONSILLECTOMY N/A 10/03/2013   Procedure: TONSILLECTOMY;  Surgeon: Serena Colonel, MD;  Location: [facility];  Service: ENT;  Laterality: N/A;   OB History     Gravida  3   Para      Term      Preterm      AB  2   Living         SAB  1   IAB  1   Ectopic      Multiple      Live Births             Home Medications    Prior to Admission medications   Medication Sig Start Date End Date Taking? Authorizing Provider  promethazine (PHENERGAN) 25 MG suppository Place 1 suppository (25 mg total) rectally every 6 (six) hours as needed for nausea or vomiting. Patient not taking: Reported on 06/16/2019 10/03/13 11/07/19  Serena Colonel, MD   Family History Family History  Problem Relation Age of Onset   Healthy Mother    Healthy Father    Alcohol abuse Paternal Grandfather    Hypertension Paternal Grandfather    Social History Social History    Tobacco Use   Smoking status: Never   Smokeless tobacco: Never  Vaping Use   Vaping Use: Every day  Substance Use Topics   Alcohol use: Yes   Drug use: Yes    Types: Marijuana    Comment: occ   Allergies   Patient has no known allergies.  Review of Systems Review of Systems Pertinent findings noted in history of present illness.   Physical Exam Triage Vital Signs ED Triage Vitals  Enc Vitals Group     BP 02/01/21 0827 (!) 147/82     Pulse Rate 02/01/21 0827 72     Resp 02/01/21 0827 18     Temp 02/01/21 0827 98.3 F (36.8 C)     Temp Source 02/01/21 0827 Oral     SpO2 02/01/21 0827 98 %     Weight --      Height --      Head Circumference --      Peak Flow --      Pain Score 02/01/21 0826 5     Pain Loc --      Pain Edu? --  Excl. in GC? --   No data found.  Updated Vital Signs Pulse 81   Temp 98.6 F (37 C) (Oral)   Resp 18   SpO2 98%   Breastfeeding No   Physical Exam Vitals and nursing note reviewed.  Constitutional:      General: She is not in acute distress.    Appearance: Normal appearance. She is not ill-appearing.  HENT:     Head: Normocephalic and atraumatic.  Eyes:     General: Lids are normal.        Right eye: No discharge.        Left eye: No discharge.     Extraocular Movements: Extraocular movements intact.     Conjunctiva/sclera: Conjunctivae normal.     Right eye: Right conjunctiva is not injected.     Left eye: Left conjunctiva is not injected.  Neck:     Trachea: Trachea and phonation normal.  Cardiovascular:     Rate and Rhythm: Normal rate and regular rhythm.     Pulses: Normal pulses.     Heart sounds: Normal heart sounds. No murmur heard.   No friction rub. No gallop.  Pulmonary:     Effort: Pulmonary effort is normal. No accessory muscle usage, prolonged expiration or respiratory distress.     Breath sounds: Normal breath sounds. No stridor, decreased air movement or transmitted upper airway sounds. No decreased  breath sounds, wheezing, rhonchi or rales.  Chest:     Chest wall: No tenderness.  Genitourinary:    Comments: Patient politely declines pelvic exam today, patient provided a vaginal swab for testing. Musculoskeletal:        General: Normal range of motion.     Cervical back: Normal range of motion and neck supple. Normal range of motion.  Lymphadenopathy:     Cervical: No cervical adenopathy.  Skin:    General: Skin is warm and dry.     Findings: No erythema or rash.  Neurological:     General: No focal deficit present.     Mental Status: She is alert and oriented to person, place, and time.  Psychiatric:        Mood and Affect: Mood normal.        Behavior: Behavior normal.    Visual Acuity Right Eye Distance:   Left Eye Distance:   Bilateral Distance:    Right Eye Near:   Left Eye Near:    Bilateral Near:     [facility] Couse / Diagnostics / Procedures:    EKG  Radiology No results found.  Procedures Procedures (including critical care time)  UC Diagnoses / Final Clinical Impressions(s)   I have reviewed the triage vital signs and the nursing notes.  Pertinent labs & imaging results that were available during my care of the patient were reviewed by me and considered in my medical decision making (see chart for details).    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   STD screening was performed, patient advised that the results be posted to their MyChart and if any of the results are positive, they  will be notified by phone, further treatment will be provided as  indicated based on results of STD screening.   Urine pregnancy test was negative.  Urine dip today was positive for microscopic hematuria which is consistent with vaginal yeast infection.  Urine culture will be performed per our protocol to rule out other possible bacterial urinary tract infection.     Patient advised that they will be contacted with results of the urine culture and that treatment will be provided as indicated based on results.   Return precautions advised.  ED Prescriptions     Medication Sig Dispense Auth. Provider   metroNIDAZOLE (FLAGYL) 500 MG tablet Take 1 tablet (500 mg total) by mouth 2 (two) times daily for 7 days. 14 tablet Theadora RamaMorgan, Emmery Seiler Scales, PA-C   metroNIDAZOLE (METROGEL) 0.75 % vaginal gel Insert vaginally using applicator twice weekly for 3 months. 240 g Theadora RamaMorgan, Joshual Terrio Scales, PA-C   fluconazole (DIFLUCAN) 150 MG tablet Take first tablet on day 1.  Take second tablet 3 days after first tablet.  Take third tablet 3 days after second tablet. 3 tablet Theadora RamaMorgan, Corin Formisano Scales, PA-C      PDMP not reviewed this encounter.  Pending results:  Labs Reviewed  POCT URINALYSIS DIP (MANUAL ENTRY) - Abnormal; Notable for the following components:      Result Value   Protein Ur, POC =30 (*)    Leukocytes, UA Large (3+) (*)    All other components within normal limits  POCT URINE PREGNANCY - Normal  CERVICOVAGINAL ANCILLARY ONLY    Medications Ordered in UC: Medications - No data to display  Disposition Upon Discharge:  Condition: stable for discharge home  Patient presented with concern for an acute illness with associated systemic symptoms and significant discomfort requiring urgent management. In my opinion, this is a condition that a prudent lay person (someone who possesses an average knowledge of health and medicine) may potentially expect to result in complications if not addressed urgently such as respiratory distress, impairment of bodily function or  dysfunction of bodily organs.   As such, the patient has been evaluated and assessed, work-up was performed and treatment was provided in alignment with urgent care protocols and evidence based medicine.  Patient/parent/caregiver has been advised that the patient may require follow up for further testing and/or treatment if the symptoms continue in spite of treatment, as clinically indicated and appropriate.  Routine symptom specific, illness specific and/or disease specific instructions were discussed with the patient and/or caregiver at length.  Prevention strategies for avoiding STD exposure were also discussed.  The patient will follow up with their current PCP if and as advised. If the patient does not currently have a PCP we will assist them in obtaining one.   The patient may need specialty follow up if the symptoms continue, in spite of conservative treatment and management, for further workup, evaluation, consultation and treatment as clinically indicated and appropriate.  Patient/parent/caregiver verbalized understanding and agreement of plan as discussed.  All questions were addressed during visit.  Please see discharge instructions below for further details of plan.  Discharge Instructions:   Discharge Instructions      Your urine pregnancy test today is negative.  Your urinalysis results revealed a large amount of red blood cells which believe is consistent with a yeast infection that you are having at this time.  To be complete, I recommend that we rule out urinary tract infections as well, we will send your urine sample to the lab for  culture to look for any other bacteria growing in your urinary tract.  As we discussed, I recommend that you take the 3-dose course of Diflucan meaning 1 tablet every 3 days for a full 3 doses, I have sent this prescription to your pharmacy.  Please discuss weekly suppressive therapy with your gynecologist which usually involves 1 tablet of Diflucan  every week for a full 6 months.  Unfortunately, the alternative to metronidazole that we discussed is not covered by [facility] family-planning.  Instead, I provided you with usual prescription for metronidazole for full 7-day course.    Current treatment guidelines for recurrent bacterial vaginosis recommend that once you complete the 7-day course of metronidazole tablets, you can begin a maintenance therapy with the metronidazole intravaginal gel inserted twice weekly for 4 to 6 months.    I was thinking that perhaps not having to insert this every night but instead every second or third night would make this particular treatment regimen more tolerable.  I went ahead and sent this prescription to your pharmacy just in case she would like to try this but I understand if you choose not to.  Thank you for visiting urgent care today.      This office note has been dictated using Teaching laboratory technician.  Unfortunately, and despite my best efforts, this method of dictation can sometimes lead to occasional typographical or grammatical errors.  I apologize in advance if this occurs.      Theadora Rama Scales, PA-C 08/24/21 1549

## 2021-08-24 NOTE — Discharge Instructions (Signed)
Your urine pregnancy test today is negative.  Your urinalysis results revealed a large amount of red blood cells which believe is consistent with a yeast infection that you are having at this time.  To be complete, I recommend that we rule out urinary tract infections as well, we will send your urine sample to the lab for culture to look for any other bacteria growing in your urinary tract.  As we discussed, I recommend that you take the 3-dose course of Diflucan meaning 1 tablet every 3 days for a full 3 doses, I have sent this prescription to your pharmacy.  Please discuss weekly suppressive therapy with your gynecologist which usually involves 1 tablet of Diflucan every week for a full 6 months.  Unfortunately, the alternative to metronidazole that we discussed is not covered by [facility] family-planning.  Instead, I provided you with usual prescription for metronidazole for full 7-day course.    Current treatment guidelines for recurrent bacterial vaginosis recommend that once you complete the 7-day course of metronidazole tablets, you can begin a maintenance therapy with the metronidazole intravaginal gel inserted twice weekly for 4 to 6 months.    I was thinking that perhaps not having to insert this every night but instead every second or third night would make this particular treatment regimen more tolerable.  I went ahead and sent this prescription to your pharmacy just in case she would like to try this but I understand if you choose not to.  Thank you for visiting urgent care today.

## 2021-08-24 NOTE — ED Triage Notes (Signed)
Pt here with vaginal itching and odor x 1 week. Prone to yeast infections, but would like UA, Upreg and full swab for STIs just in case.

## 2021-08-27 LAB — CERVICOVAGINAL ANCILLARY ONLY
Bacterial Vaginitis (gardnerella): POSITIVE — AB
Candida Glabrata: NEGATIVE
Candida Vaginitis: POSITIVE — AB
Chlamydia: NEGATIVE
Comment: NEGATIVE
Comment: NEGATIVE
Comment: NEGATIVE
Comment: NEGATIVE
Comment: NEGATIVE
Comment: NORMAL
Neisseria Gonorrhea: NEGATIVE
Trichomonas: NEGATIVE

## 2021-10-01 ENCOUNTER — Ambulatory Visit
Admission: RE | Admit: 2021-10-01 | Discharge: 2021-10-01 | Disposition: A | Discharge status: Home / Self Care | Payer: Medicaid Other | Source: Ambulatory Visit | Attending: Urgent Care | Admitting: Urgent Care

## 2021-10-01 ENCOUNTER — Ambulatory Visit: Payer: Self-pay

## 2021-10-01 VITALS — BP 113/77 | HR 63 | Temp 98.7°F | Resp 18

## 2021-10-01 DIAGNOSIS — B9689 Other specified bacterial agents as the cause of diseases classified elsewhere: Secondary | ICD-10-CM | POA: Insufficient documentation

## 2021-10-01 DIAGNOSIS — N760 Acute vaginitis: Secondary | ICD-10-CM

## 2021-10-01 DIAGNOSIS — N898 Other specified noninflammatory disorders of vagina: Secondary | ICD-10-CM | POA: Insufficient documentation

## 2021-10-01 MED ORDER — FLUCONAZOLE 150 MG PO TABS: 150.0000 mg | ORAL_TABLET | ORAL | 0 refills | Status: DC

## 2021-10-01 MED ORDER — CLINDAMYCIN HCL 300 MG PO CAPS
300.0000 mg | ORAL_CAPSULE | Freq: Two times a day (BID) | ORAL | 0 refills | Status: DC
Start: 2021-10-01 — End: 2021-11-22

## 2021-10-01 NOTE — ED Provider Notes (Signed)
[facility]   MRN: [id number] DOB: Aug 12, 1995  Subjective:   Shelly Mccann is a 26 y.o. female presenting for 4-day history of recurrent vaginal discharge, irritation and itching.  Believes that this was due to having to use tampons when she was at the beach.  Normally she uses pads for her cycle but did not have another option while she was traveling.  Patient has had multiple bouts of bacterial vaginosis and yeast infection this year.  Has done better with oral antibiotics and oral antifungals as opposed to topical.  She has not yet seen a gynecologist.  Plans on establishing care soon.  No concern for pregnancy.  Patient has been regular with her periods, LMP was 09/22/2021.  No current facility-administered medications for this encounter.  Current Outpatient Medications:    fluconazole (DIFLUCAN) 150 MG tablet, Take first tablet on day 1.  Take second tablet 3 days after first tablet.  Take third tablet 3 days after second tablet., Disp: 3 tablet, Rfl: 0   metroNIDAZOLE (METROGEL) 0.75 % vaginal gel, Insert vaginally using applicator twice weekly for 3 months., Disp: 240 g, Rfl: 1   No Known Allergies  History reviewed. No pertinent past medical history.   Past Surgical History:  Procedure Laterality Date   TONSILLECTOMY N/A 10/03/2013   Procedure: TONSILLECTOMY;  Surgeon: Serena Colonel, MD;  Location: [facility];  Service: ENT;  Laterality: N/A;    Family History  Problem Relation Age of Onset   Healthy Mother    Healthy Father    Alcohol abuse Paternal Grandfather    Hypertension Paternal Grandfather     Social History   Tobacco Use   Smoking status: Never   Smokeless tobacco: Never  Vaping Use   Vaping Use: Every day  Substance Use Topics   Alcohol use: Yes   Drug use: Yes    Types: Marijuana    Comment: occ    ROS   Objective:   Vitals: BP 113/77 (BP Location: Right Arm)   Pulse 63   Temp 98.7 F (37.1 C) (Oral)    Resp 18   LMP 09/22/2021   SpO2 98%   Physical Exam Constitutional:      General: She is not in acute distress.    Appearance: Normal appearance. She is well-developed. She is not ill-appearing, toxic-appearing or diaphoretic.  HENT:     Head: Normocephalic and atraumatic.     Nose: Nose normal.     Mouth/Throat:     Mouth: Mucous membranes are moist.  Eyes:     General: No scleral icterus.       Right eye: No discharge.        Left eye: No discharge.     Extraocular Movements: Extraocular movements intact.  Cardiovascular:     Rate and Rhythm: Normal rate.  Pulmonary:     Effort: Pulmonary effort is normal.  Skin:    General: Skin is warm and dry.  Neurological:     General: No focal deficit present.     Mental Status: She is alert and oriented to person, place, and time.  Psychiatric:        Mood and Affect: Mood normal.        Behavior: Behavior normal.     Assessment and Plan :   PDMP not reviewed this encounter.  1. Acute vaginitis   2. Vaginal discharge   3. Bacterial vaginosis    Patient has used metronidazole multiple times  this year.  Offered to refill that but also offered clindamycin which the patient ended up wanting.  If she experiences no relief in the next 2 to 3 days, recommend switching back to her of metronidazole based off her results.  Otherwise we will also treat empirically for recurrent yeast vaginitis with fluconazole.  Labs pending. Counseled patient on potential for adverse effects with medications prescribed/recommended today, ER and return-to-clinic precautions discussed, patient verbalized understanding.    Wallis Bamberg, PA-C 10/01/21 1048

## 2021-10-02 LAB — CERVICOVAGINAL ANCILLARY ONLY
Bacterial Vaginitis (gardnerella): NEGATIVE
Candida Glabrata: NEGATIVE
Candida Vaginitis: NEGATIVE
Chlamydia: NEGATIVE
Comment: NEGATIVE
Comment: NEGATIVE
Comment: NEGATIVE
Comment: NEGATIVE
Comment: NEGATIVE
Comment: NORMAL
Neisseria Gonorrhea: NEGATIVE
Trichomonas: NEGATIVE

## 2021-11-22 ENCOUNTER — Ambulatory Visit
Admission: RE | Admit: 2021-11-22 | Discharge: 2021-11-22 | Disposition: A | Discharge status: Home / Self Care | Payer: Medicaid Other | Source: Ambulatory Visit | Attending: Urgent Care | Admitting: Urgent Care

## 2021-11-22 VITALS — BP 104/64 | HR 62 | Temp 97.8°F | Resp 18

## 2021-11-22 DIAGNOSIS — B3731 Acute candidiasis of vulva and vagina: Secondary | ICD-10-CM

## 2021-11-22 DIAGNOSIS — B9689 Other specified bacterial agents as the cause of diseases classified elsewhere: Secondary | ICD-10-CM | POA: Insufficient documentation

## 2021-11-22 DIAGNOSIS — N760 Acute vaginitis: Secondary | ICD-10-CM

## 2021-11-22 LAB — POCT URINE PREGNANCY: Preg Test, Ur: NEGATIVE

## 2021-11-22 MED ORDER — CLINDAMYCIN HCL 300 MG PO CAPS
300.0000 mg | ORAL_CAPSULE | Freq: Two times a day (BID) | ORAL | 0 refills | Status: DC
Start: 2021-11-22 — End: 2022-01-18

## 2021-11-22 MED ORDER — BORIC ACID VAGINAL 600 MG VA SUPP: VAGINAL | 1 refills | Status: DC

## 2021-11-22 MED ORDER — FLUCONAZOLE 150 MG PO TABS: 150.0000 mg | ORAL_TABLET | ORAL | 0 refills | Status: DC

## 2021-11-22 NOTE — ED Triage Notes (Signed)
Pt here with continuing vaginal discomfort, discharge and odor. Pt has recurrent yeast infections and BV.

## 2021-11-22 NOTE — ED Provider Notes (Signed)
[facility]   MRN: [id number] DOB: Apr 22, 1995  Subjective:   Shelly Mccann is a 26 y.o. female presenting for several day history of recurrent vaginal discharge, malodor, vaginal discomfort.  Patient has had a very difficult time with recurrent bacterial vaginosis and yeast infections.  Last episode we decided to use clindamycin and this worked very well for patient.  She also did well with fluconazole.  Would like a similar regimen.  Unfortunately she has no insurance and has had difficulty getting in with a gynecologist.  Would like to make sure that she does not have a pregnancy.  She is not opposed to STI testing.  No current facility-administered medications for this encounter.  Current Outpatient Medications:    clindamycin (CLEOCIN) 300 MG capsule, Take 1 capsule (300 mg total) by mouth 2 (two) times daily., Disp: 14 capsule, Rfl: 0   fluconazole (DIFLUCAN) 150 MG tablet, Take 1 tablet (150 mg total) by mouth every 3 (three) days., Disp: 4 tablet, Rfl: 0   No Known Allergies  History reviewed. No pertinent past medical history.   Past Surgical History:  Procedure Laterality Date   TONSILLECTOMY N/A 10/03/2013   Procedure: TONSILLECTOMY;  Surgeon: Serena Colonel, MD;  Location: [facility];  Service: ENT;  Laterality: N/A;    Family History  Problem Relation Age of Onset   Healthy Mother    Healthy Father    Alcohol abuse Paternal Grandfather    Hypertension Paternal Grandfather     Social History   Tobacco Use   Smoking status: Never   Smokeless tobacco: Never  Vaping Use   Vaping Use: Every day  Substance Use Topics   Alcohol use: Yes   Drug use: Yes    Types: Marijuana    Comment: occ    ROS   Objective:   Vitals: BP 104/64   Pulse 62   Temp 97.8 F (36.6 C) (Oral)   Resp 18   SpO2 98%   Physical Exam Constitutional:      General: She is not in acute distress.    Appearance: Normal appearance. She is  well-developed. She is not ill-appearing, toxic-appearing or diaphoretic.  HENT:     Head: Normocephalic and atraumatic.     Nose: Nose normal.     Mouth/Throat:     Mouth: Mucous membranes are moist.  Eyes:     General: No scleral icterus.       Right eye: No discharge.        Left eye: No discharge.     Extraocular Movements: Extraocular movements intact.     Conjunctiva/sclera: Conjunctivae normal.  Cardiovascular:     Rate and Rhythm: Normal rate.  Pulmonary:     Effort: Pulmonary effort is normal.  Abdominal:     General: Bowel sounds are normal. There is no distension.     Palpations: Abdomen is soft. There is no mass.     Tenderness: There is no abdominal tenderness. There is no right CVA tenderness, left CVA tenderness, guarding or rebound.  Skin:    General: Skin is warm and dry.  Neurological:     General: No focal deficit present.     Mental Status: She is alert and oriented to person, place, and time.  Psychiatric:        Mood and Affect: Mood normal.        Behavior: Behavior normal.        Thought Content: Thought content normal.  Judgment: Judgment normal.     Results for orders placed or performed during the hospital encounter of 11/22/21 (from the past 24 hour(s))  POCT urine pregnancy     Status: None   Collection Time: 11/22/21  6:12 PM  Result Value Ref Range   Preg Test, Ur Negative Negative    Assessment and Plan :   PDMP not reviewed this encounter.  1. Bacterial vaginosis   2. Yeast vaginitis    Given the persistent and recurrent nature of her infections, recommended using boric acid suppositories in conjunction with a repeat clindamycin course which she did well with.  Will cover for yeast vaginitis using fluconazole.  Recommended she try to establish care with a gynecologist at the [facility] for women's [facility]. Counseled patient on potential for adverse effects with medications prescribed/recommended today, ER and return-to-clinic  precautions discussed, patient verbalized understanding.    Wallis Bamberg, [place] 11/22/21 [phone]

## 2021-11-25 LAB — CERVICOVAGINAL ANCILLARY ONLY
Bacterial Vaginitis (gardnerella): NEGATIVE
Candida Glabrata: NEGATIVE
Candida Vaginitis: NEGATIVE
Chlamydia: NEGATIVE
Comment: NEGATIVE
Comment: NEGATIVE
Comment: NEGATIVE
Comment: NEGATIVE
Comment: NEGATIVE
Comment: NORMAL
Neisseria Gonorrhea: NEGATIVE
Trichomonas: NEGATIVE

## 2022-01-18 ENCOUNTER — Ambulatory Visit
Admission: RE | Admit: 2022-01-18 | Discharge: 2022-01-18 | Disposition: A | Discharge status: Home / Self Care | Payer: Medicaid Other | Source: Ambulatory Visit | Attending: Urgent Care | Admitting: Urgent Care

## 2022-01-18 VITALS — BP 120/77 | HR 70 | Temp 98.0°F | Resp 16

## 2022-01-18 DIAGNOSIS — B379 Candidiasis, unspecified: Secondary | ICD-10-CM | POA: Insufficient documentation

## 2022-01-18 DIAGNOSIS — L089 Local infection of the skin and subcutaneous tissue, unspecified: Secondary | ICD-10-CM | POA: Insufficient documentation

## 2022-01-18 DIAGNOSIS — Z113 Encounter for screening for infections with a predominantly sexual mode of transmission: Secondary | ICD-10-CM | POA: Insufficient documentation

## 2022-01-18 DIAGNOSIS — L731 Pseudofolliculitis barbae: Secondary | ICD-10-CM | POA: Insufficient documentation

## 2022-01-18 LAB — POCT URINALYSIS DIP (MANUAL ENTRY)
Bilirubin, UA: NEGATIVE
Glucose, UA: NEGATIVE mg/dL
Ketones, POC UA: NEGATIVE mg/dL
Leukocytes, UA: NEGATIVE
Nitrite, UA: NEGATIVE
Protein Ur, POC: NEGATIVE mg/dL
Spec Grav, UA: >=1.03 — AB (ref 1.010–1.025)
Urobilinogen, UA: 0.2 E.U./dL
pH, UA: 6 (ref 5.0–8.0)

## 2022-01-18 LAB — POCT URINE PREGNANCY: Preg Test, Ur: POSITIVE — AB

## 2022-01-18 MED ORDER — MUPIROCIN 2 % EX OINT: 1.0000 | TOPICAL_OINTMENT | Freq: Three times a day (TID) | CUTANEOUS | 0 refills | Status: DC

## 2022-01-18 MED ORDER — MICONAZOLE NITRATE 2 % VA CREA: 1.0000 | TOPICAL_CREAM | Freq: Every day | VAGINAL | 0 refills | Status: AC

## 2022-01-18 NOTE — ED Triage Notes (Signed)
Pt reports she is approx 4-[redacted] wks pregnant. States prone to yeast infections and BV. C/O starting with vaginal discharge onset 1 wk ago. Also reports possible "ingrown hair" to pubic area.  C/O very fine pruritic, burning, bumpy rash to face onset approx 1 wk ago. States started in forehead and has spread elsewhere on face.

## 2022-01-18 NOTE — Discharge Instructions (Signed)
You were tested today for gonorrhea, chlamydia, trichomonas, BV, and yeast. You were also tested for syphillis and HIV. We will call you with the results of your test once received.  Please avoid all forms of intercourse until test results received, and if positive for any STI, all partners will need to complete entire course of antibiotics prior to resuming.  As always, practice safer sexual practices by using protection each and every time, and limiting number of partners.   Your symptoms appear consistent with yeast vaginitis.  Due to your positive pregnancy, oral Diflucan is not recommended.  Please use the Monistat 7-day vaginal cream and apply nightly.  The rash on your face looks like a common rash secondary to hormonal changes in pregnancy.  This is self-limited and should resolve without treatment.  Please monitor and follow-up with your obstetrician should you have any worsening symptoms.  Please apply the topical mupirocin to the ingrown hair on your right vulva.  Do this 3 times daily until resolved.

## 2022-01-20 LAB — CERVICOVAGINAL ANCILLARY ONLY
Bacterial Vaginitis (gardnerella): NEGATIVE
Candida Glabrata: NEGATIVE
Candida Vaginitis: NEGATIVE
Chlamydia: NEGATIVE
Comment: NEGATIVE
Comment: NEGATIVE
Comment: NEGATIVE
Comment: NEGATIVE
Comment: NEGATIVE
Comment: NORMAL
Neisseria Gonorrhea: NEGATIVE
Trichomonas: NEGATIVE

## 2022-01-20 LAB — RPR: RPR Ser Ql: NONREACTIVE

## 2022-01-20 LAB — HIV ANTIBODY (ROUTINE TESTING W REFLEX): HIV Screen 4th Generation wRfx: NONREACTIVE

## 2022-01-21 NOTE — ED Provider Notes (Signed)
[place]    CSN: [id number] Arrival date & time: 01/18/22  0815      History   Chief Complaint Chief Complaint  Patient presents with   Rash   Vaginal Discharge    + home preg    HPI Shelly Mccann is a 26 y.o. female.   26yo female presents today with several concerns. Her primary concern is "bumps" on her face. 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 However this is not a new job, but the bumps have only been there several days. They are intermittently itchy. States they have improved some with OTC medications. Additionally, patient is concerned about vaginal discharge.  States she seems to get this monthly.  Has a significant history of bacterial vaginosis in the past.  Had a recent positive home pregnancy test, believes she is 4 to [redacted] weeks pregnant.  Denies any dysuria, hematuria, pelvic pain.  Denies any bleeding. She is requesting a full STI workup. Lastly, patient believes she has an ingrown hair to the right vulva.  States she gets these frequently.  Usually goes away with over-the-counter treatment, but this 1 has not.   Rash   History reviewed. No pertinent past medical history.  Patient Active Problem List   Diagnosis Date Noted   S/P tonsillectomy 10/03/2013    Past Surgical History:  Procedure Laterality Date   TONSILLECTOMY N/A 10/03/2013   Procedure: TONSILLECTOMY;  Surgeon: Izora Gala, MD;  Location: [facility];  Service: ENT;  Laterality: N/A;    OB History     Gravida  4   Para      Term      Preterm      AB  2   Living         SAB  1   IAB  1   Ectopic      Multiple      Live Births               Home Medications    Prior to Admission medications   Medication Sig Start Date End Date Taking? Authorizing Provider  miconazole (MONISTAT 7 SIMPLY CURE) 2 % vaginal cream Place 1 Applicatorful vaginally at bedtime  for 7 days. 01/18/22 01/25/22 Yes Deloyd Handy L, PA  mupirocin ointment (BACTROBAN) 2 % Apply 1 Application topically 3 (three) times daily. 01/18/22  Yes Elizabeht Suto L, PA  promethazine (PHENERGAN) 25 MG suppository Place 1 suppository (25 mg total) rectally every 6 (six) hours as needed for nausea or vomiting. Patient not taking: Reported on 06/16/2019 10/03/13 11/07/19  Izora Gala, MD    Family History Family History  Problem Relation Age of Onset   Healthy Mother    Healthy Father    Alcohol abuse Paternal Grandfather    Hypertension Paternal Grandfather     Social History Social History   Tobacco Use   Smoking status: Never   Smokeless tobacco: Never  Vaping Use   Vaping Use: Every day  Substance Use Topics   Alcohol use: Not Currently   Drug use: Yes    Types: Marijuana    Comment: occasionally     Allergies   Patient has no known allergies.   Review of Systems Review of Systems  Skin:  Positive for rash.  As per HPI   Physical Exam Triage Vital Signs ED Triage Vitals  Enc Vitals Group  BP 01/18/22 0827 120/77     Pulse Rate 01/18/22 0827 70     Resp 01/18/22 0827 16     Temp 01/18/22 0827 98 F (36.7 C)     Temp Source 01/18/22 0827 Oral     SpO2 01/18/22 0827 98 %     Weight --      Height --      Head Circumference --      Peak Flow --      Pain Score 01/18/22 0828 0     Pain Loc --      Pain Edu? --      Excl. in [place]? --    No data found.  Updated Vital Signs BP 120/77   Pulse 70   Temp 98 F (36.7 C) (Oral)   Resp 16   LMP 12/12/2021 (Exact Date)   SpO2 98%   Visual Acuity Right Eye Distance:   Left Eye Distance:   Bilateral Distance:    Right Eye Near:   Left Eye Near:    Bilateral Near:     Physical Exam Vitals and nursing note reviewed.  Constitutional:      General: She is not in acute distress.    Appearance: Normal appearance. She is normal weight. She is not ill-appearing, toxic-appearing or diaphoretic.   HENT:     Head: Normocephalic and atraumatic.     Right Ear: External ear normal.     Left Ear: External ear normal.     Nose: Nose normal.     Mouth/Throat:     Mouth: Mucous membranes are moist.     Pharynx: Oropharynx is clear. No oropharyngeal exudate or posterior oropharyngeal erythema.  Eyes:     General: No scleral icterus.       Right eye: No discharge.        Left eye: No discharge.     Extraocular Movements: Extraocular movements intact.     Conjunctiva/sclera: Conjunctivae normal.     Pupils: Pupils are equal, round, and reactive to light.  Cardiovascular:     Rate and Rhythm: Normal rate.     Pulses: Normal pulses.  Pulmonary:     Effort: Pulmonary effort is normal.  Abdominal:     General: Abdomen is flat. Bowel sounds are normal.     Palpations: Abdomen is soft.  Genitourinary:    Vagina: Vaginal discharge (white) present.  Musculoskeletal:     Left lower leg: No edema.  Skin:    General: Skin is warm and dry.     Capillary Refill: Capillary refill takes less than 2 seconds.     Coloration: Skin is not jaundiced.     Findings: Lesion (single ingrown hair noted to R vulva) present. No bruising or erythema.     Comments: Only a few small pustules noted to T zone of face, otherwise no appreciable rash on face  Neurological:     General: No focal deficit present.     Mental Status: She is alert and oriented to person, place, and time.      UC Treatments / Results  Labs (all labs ordered are listed, but only abnormal results are displayed) Labs Reviewed  POCT URINALYSIS DIP (MANUAL ENTRY) - Abnormal; Notable for the following components:      Result Value   Spec Grav, UA >=1.030 (*)    Blood, UA trace-intact (*)    All other components within normal limits  POCT URINE PREGNANCY - Abnormal; Notable for the following components:  Preg Test, Ur Positive (*)    All other components within normal limits  RPR   Narrative:    Performed at:  [address], [place], [place]  [id number] Lab Director: Rush Farmer MD, Phone:  [phone]  HIV ANTIBODY (ROUTINE TESTING W REFLEX)   Narrative:    Performed at:  [facility] [address] [place], [place]  [id number] Lab Director: Rush Farmer MD, Phone:  [phone]  CERVICOVAGINAL ANCILLARY ONLY    EKG   Radiology No results found.  Procedures Procedures (including critical care time)  Medications Ordered in UC Medications - No data to display  Initial Impression / Assessment and Plan / UC Course  I have reviewed the triage vital signs and the nursing notes.  Pertinent labs & imaging results that were available during my care of the patient were reviewed by me and considered in my medical decision making (see chart for details).     Screen for STI - per pt request. Aptima swab and labs collected. Yeast infection - pt with sx clinically consistent with yeast. Topical monistat as PO diflucan not recommended with pregnancy. Will call with results of aptima swab if any additional treatment indicated Ingrown hair - appears to be resolving. Topical mupirocin to the irritated, swollen skin around the hair shaft. Skin pustule - rash on face looks most consistent with acne. I suspect this may be hormonal related to new pregnancy. No tx indicated, monitor at this time. Face wash with gentle skin cleansers such as Aveeno likely sufficient. Pt admits it is improving.    Final Clinical Impressions(s) / UC Diagnoses   Final diagnoses:  Screen for STD (sexually transmitted disease)  Yeast infection  Ingrown hair  Skin pustule     Discharge Instructions      You were tested today for gonorrhea, chlamydia, trichomonas, BV, and yeast. You were also tested for syphillis and HIV. We will call you with the results of your test once received.  Please avoid all forms of intercourse until test results received, and if positive for any STI, all partners  will need to complete entire course of antibiotics prior to resuming.  As always, practice safer sexual practices by using protection each and every time, and limiting number of partners.   Your symptoms appear consistent with yeast vaginitis.  Due to your positive pregnancy, oral Diflucan is not recommended.  Please use the Monistat 7-day vaginal cream and apply nightly.  The rash on your face looks like a common rash secondary to hormonal changes in pregnancy.  This is self-limited and should resolve without treatment.  Please monitor and follow-up with your obstetrician should you have any worsening symptoms.  Please apply the topical mupirocin to the ingrown hair on your right vulva.  Do this 3 times daily until resolved.    ED Prescriptions     Medication Sig Dispense Auth. Provider   miconazole (MONISTAT 7 SIMPLY CURE) 2 % vaginal cream Place 1 Applicatorful vaginally at bedtime for 7 days. 45 g Letisia Schwalb L, PA   mupirocin ointment (BACTROBAN) 2 % Apply 1 Application topically 3 (three) times daily. 22 g Shannan Garfinkel L, [place]      PDMP not reviewed this encounter.   Chaney Malling, [place] 01/21/22 1147

## 2022-04-26 ENCOUNTER — Ambulatory Visit
Admission: RE | Admit: 2022-04-26 | Discharge: 2022-04-26 | Disposition: A | Discharge status: Home / Self Care | Payer: No Typology Code available for payment source | Source: Ambulatory Visit | Attending: Urgent Care

## 2022-04-26 VITALS — BP 125/85 | HR 70 | Temp 98.0°F | Resp 18

## 2022-04-26 DIAGNOSIS — B3731 Acute candidiasis of vulva and vagina: Secondary | ICD-10-CM | POA: Diagnosis not present

## 2022-04-26 DIAGNOSIS — U071 COVID-19: Secondary | ICD-10-CM | POA: Diagnosis not present

## 2022-04-26 DIAGNOSIS — J069 Acute upper respiratory infection, unspecified: Secondary | ICD-10-CM

## 2022-04-26 DIAGNOSIS — L509 Urticaria, unspecified: Secondary | ICD-10-CM | POA: Diagnosis not present

## 2022-04-26 LAB — POCT URINE PREGNANCY: Preg Test, Ur: NEGATIVE

## 2022-04-26 MED ORDER — PSEUDOEPHEDRINE HCL 30 MG PO TABS: 30.0000 mg | ORAL_TABLET | Freq: Three times a day (TID) | ORAL | 0 refills | Status: DC | PRN

## 2022-04-26 MED ORDER — PROMETHAZINE-DM 6.25-15 MG/5ML PO SYRP: 2.5000 mL | ORAL_SOLUTION | Freq: Three times a day (TID) | ORAL | 0 refills | Status: DC | PRN

## 2022-04-26 MED ORDER — CETIRIZINE HCL 10 MG PO TABS: 10.0000 mg | ORAL_TABLET | Freq: Every day | ORAL | 0 refills | Status: DC

## 2022-04-26 MED ORDER — HYDROCORTISONE 1 % EX CREA: TOPICAL_CREAM | CUTANEOUS | 0 refills | Status: DC

## 2022-04-26 MED ORDER — BENZONATATE 100 MG PO CAPS: 100.0000 mg | ORAL_CAPSULE | Freq: Three times a day (TID) | ORAL | 0 refills | Status: DC | PRN

## 2022-04-26 MED ORDER — FLUCONAZOLE 150 MG PO TABS: 150.0000 mg | ORAL_TABLET | ORAL | 0 refills | Status: DC

## 2022-04-26 NOTE — Discharge Instructions (Addendum)
We will notify you of your test results as they arrive and may take between about 24 hours.  I encourage you to sign up for MyChart if you have not already done so as this can be the easiest way for [place] to communicate results to you online or through a phone app.  Generally, we only contact you if it is a positive test result.  In the meantime, if you develop worsening symptoms including fever, chest pain, shortness of breath despite our current treatment plan then please report to the emergency room as this may be a sign of worsening status from possible viral infection.  Otherwise, we will manage this as a viral syndrome. For sore throat or cough try using a honey-based tea. Use 3 teaspoons of honey with juice squeezed from half lemon. Place shaved pieces of ginger into 1/2-1 cup of water and warm over stove top. Then mix the ingredients and repeat every 4 hours as needed. Please take Tylenol 500mg -650mg  every 6 hours for aches and pains, fevers. Hydrate very well with at least 2 liters of water. Eat light meals such as soups to replenish electrolytes and soft fruits, veggies. Start an antihistamine like Zyrtec for postnasal drainage, sinus congestion.  You can take this together with pseudoephedrine (Sudafed) at a dose of 30mg -60 mg 2-3 times a day as needed for the same kind of congestion.  Use the cough medications as needed.   Go ahead and start fluconazole for yeast vaginitis and we will update you on your vaginal swab results when those results come to [place].

## 2022-04-26 NOTE — ED Triage Notes (Signed)
Pt c/o -nasal congestion, sneezing, sinus pressure x 3 days-also c/o rash to face x 1 week-also c/o vaginal d/c and itching x 5 days-NAD-steady gait

## 2022-04-26 NOTE — ED Provider Notes (Signed)
[facility]  Note:  This document was prepared using Systems analyst and may include unintentional dictation errors.  MRN: [id number] DOB: 1996-01-23  Subjective:   Shelly Mccann is a 27 y.o. female presenting for 3 chief complaints.   Rash - reports 1 week history of persistent intermittent rash, hives of the lower part of her face extending to the top right side of her cheek.  Believes it is related to her mask.  She did use a new detergent recently but does not have the rash anywhere else except the face as aforementioned.  No oral swelling, facial pain, vesicular lesions.  Sickness - reports 3 day history of acute onset sinus congestion, sneezing, sinus pressure, coughing.  No chest pain, shortness of breath or wheezing.  Genital - reports 5 day history of acute recurrent vaginal discharge, vaginal itching.  No urinary symptoms.  No current facility-administered medications for this encounter.  Current Outpatient Medications:    mupirocin ointment (BACTROBAN) 2 %, Apply 1 Application topically 3 (three) times daily., Disp: 22 g, Rfl: 0   No Known Allergies  History reviewed. No pertinent past medical history.   Past Surgical History:  Procedure Laterality Date   TONSILLECTOMY N/A 10/03/2013   Procedure: TONSILLECTOMY;  Surgeon: Izora Gala, MD;  Location: [facility];  Service: ENT;  Laterality: N/A;    Family History  Problem Relation Age of Onset   Healthy Mother    Healthy Father    Alcohol abuse Paternal Grandfather    Hypertension Paternal Grandfather     Social History   Tobacco Use   Smoking status: Never   Smokeless tobacco: Never  Vaping Use   Vaping Use: Every day  Substance Use Topics   Alcohol use: Not Currently   Drug use: Yes    Types: Marijuana    Comment: occasionally    ROS   Objective:   Vitals: BP 125/85 (BP Location: Right Arm)   Pulse 70   Temp 98 F (36.7 C) (Oral)    Resp 18   SpO2 99%   Physical Exam Constitutional:      General: She is not in acute distress.    Appearance: Normal appearance. She is well-developed and normal weight. She is not ill-appearing, toxic-appearing or diaphoretic.  HENT:     Head: Normocephalic and atraumatic.     Right Ear: Tympanic membrane, ear canal and external ear normal. No drainage or tenderness. No middle ear effusion. There is no impacted cerumen. Tympanic membrane is not erythematous or bulging.     Left Ear: Tympanic membrane, ear canal and external ear normal. No drainage or tenderness.  No middle ear effusion. There is no impacted cerumen. Tympanic membrane is not erythematous or bulging.     Nose: Nose normal. No congestion or rhinorrhea.     Mouth/Throat:     Mouth: Mucous membranes are moist. No oral lesions.     Pharynx: No pharyngeal swelling, oropharyngeal exudate, posterior oropharyngeal erythema or uvula swelling.     Tonsils: No tonsillar exudate or tonsillar abscesses.  Eyes:     General: No scleral icterus.       Right eye: No discharge.        Left eye: No discharge.     Extraocular Movements: Extraocular movements intact.     Right eye: Normal extraocular motion.     Left eye: Normal extraocular motion.     Conjunctiva/sclera: Conjunctivae normal.  Cardiovascular:  Rate and Rhythm: Normal rate and regular rhythm.     Heart sounds: Normal heart sounds. No murmur heard.    No friction rub. No gallop.  Pulmonary:     Effort: Pulmonary effort is normal. No respiratory distress.     Breath sounds: No stridor. No wheezing, rhonchi or rales.  Chest:     Chest wall: No tenderness.  Musculoskeletal:     Cervical back: Normal range of motion and neck supple.  Lymphadenopathy:     Cervical: No cervical adenopathy.  Skin:    General: Skin is warm and dry.     Findings: Rash (scant <1cm urticarial lesions over lower part of face) present.  Neurological:     General: No focal deficit present.      Mental Status: She is alert and oriented to person, place, and time.  Psychiatric:        Mood and Affect: Mood normal.        Behavior: Behavior normal.     Results for orders placed or performed during the hospital encounter of 04/26/22 (from the past 24 hour(s))  POCT urine pregnancy     Status: None   Collection Time: 04/26/22  2:18 PM  Result Value Ref Range   Preg Test, Ur Negative Negative    Assessment and Plan :   PDMP not reviewed this encounter.  1. Viral upper respiratory infection   2. Urticaria   3. Yeast vaginitis    Use topical hydrocortisone cream for the face.  Monitor for new exposures.  Discussed possibility of the mask as an allergen.  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  Will treat empirically for recurrent yeast vaginitis.  Vaginal swab results pending.  Will manage for viral illness such as viral URI, viral syndrome, viral rhinitis, COVID-19. Recommended supportive care. Offered scripts for symptomatic relief. Testing is pending. Counseled patient on potential for adverse effects with medications prescribed/recommended today, ER and return-to-clinic precautions discussed, patient verbalized understanding.     Jaynee Eagles, [place] 04/26/22 1529

## 2022-04-27 LAB — SARS CORONAVIRUS 2 (TAT 6-24 HRS): SARS Coronavirus 2: POSITIVE — AB

## 2022-04-28 LAB — CERVICOVAGINAL ANCILLARY ONLY
Bacterial Vaginitis (gardnerella): NEGATIVE
Candida Glabrata: NEGATIVE
Candida Vaginitis: NEGATIVE
Chlamydia: NEGATIVE
Comment: NEGATIVE
Comment: NEGATIVE
Comment: NEGATIVE
Comment: NEGATIVE
Comment: NEGATIVE
Comment: NORMAL
Neisseria Gonorrhea: NEGATIVE
Trichomonas: NEGATIVE

## 2022-07-30 ENCOUNTER — Ambulatory Visit
Admission: RE | Admit: 2022-07-30 | Discharge: 2022-07-30 | Disposition: A | Discharge status: Home / Self Care | Payer: No Typology Code available for payment source | Source: Ambulatory Visit | Attending: Nurse Practitioner | Admitting: Nurse Practitioner

## 2022-07-30 VITALS — BP 129/85 | HR 66 | Temp 98.3°F | Resp 16

## 2022-07-30 DIAGNOSIS — N760 Acute vaginitis: Secondary | ICD-10-CM | POA: Diagnosis present

## 2022-07-30 MED ORDER — METRONIDAZOLE 500 MG PO TABS: 500.0000 mg | ORAL_TABLET | Freq: Two times a day (BID) | ORAL | 0 refills | Status: DC

## 2022-07-30 MED ORDER — FLUCONAZOLE 150 MG PO TABS: 150.0000 mg | ORAL_TABLET | ORAL | 0 refills | Status: DC

## 2022-07-30 NOTE — Discharge Instructions (Signed)
The clinic will contact you with results of the testing done today Flagyl twice daily for 7 days Diflucan as prescribed Follow-up with gynecology for further evaluation of your recurrent symptoms Please go to the ER if you have any worsening symptoms

## 2022-07-30 NOTE — ED Triage Notes (Addendum)
Pt states white vaginal discharge and odor for the past week.  States she has a history of BV. Pt would also like to be tested for STD's

## 2022-07-30 NOTE — ED Provider Notes (Signed)
[facility]    CSN: [id number] Arrival date & time: 07/30/22  1733      History   Chief Complaint Chief Complaint  Patient presents with   Vaginal Discharge    Bv and yeast infection - Entered by patient    HPI Shelly Mccann is a 27 y.o. female presents for evaluation of vaginal discharge.  Patient reports 1 week of a slightly thick malodorous discharge.  Denies any dysuria, fevers, nausea/vomiting, flank pain.  No STD exposure but would like screening.  Reports history of recurrent yeast and BV infections.  She is currently trying to establish with GYN for further workup.  No OTC medications have been used.  No other concerns at this time.   Vaginal Discharge   History reviewed. No pertinent past medical history.  Patient Active Problem List   Diagnosis Date Noted   S/P tonsillectomy 10/03/2013    Past Surgical History:  Procedure Laterality Date   TONSILLECTOMY N/A 10/03/2013   Procedure: TONSILLECTOMY;  Surgeon: Serena Colonel, MD;  Location: [facility];  Service: ENT;  Laterality: N/A;    OB History     Gravida  4   Para      Term      Preterm      AB  2   Living         SAB  1   IAB  1   Ectopic      Multiple      Live Births               Home Medications    Prior to Admission medications   Medication Sig Start Date End Date Taking? Authorizing Provider  metroNIDAZOLE (FLAGYL) 500 MG tablet Take 1 tablet (500 mg total) by mouth 2 (two) times daily. 07/30/22  Yes Radford Pax, NP  benzonatate (TESSALON) 100 MG capsule Take 1 capsule (100 mg total) by mouth 3 (three) times daily as needed for cough. 04/26/22   Wallis Bamberg, PA-C  cetirizine (ZYRTEC ALLERGY) 10 MG tablet Take 1 tablet (10 mg total) by mouth daily. 04/26/22   Wallis Bamberg, PA-C  fluconazole (DIFLUCAN) 150 MG tablet Take 1 tablet (150 mg total) by mouth every 3 (three) days. 07/30/22   Radford Pax, NP  hydrocortisone cream 1 % Apply to affected area 2  times daily. 04/26/22   Wallis Bamberg, PA-C  mupirocin ointment (BACTROBAN) 2 % Apply 1 Application topically 3 (three) times daily. 01/18/22   Crain, Whitney L, PA  promethazine-dextromethorphan (PROMETHAZINE-DM) 6.25-15 MG/5ML syrup Take 2.5 mLs by mouth 3 (three) times daily as needed for cough. 04/26/22   Wallis Bamberg, PA-C  pseudoephedrine (SUDAFED) 30 MG tablet Take 1 tablet (30 mg total) by mouth every 8 (eight) hours as needed for congestion. 04/26/22   Wallis Bamberg, PA-C  promethazine (PHENERGAN) 25 MG suppository Place 1 suppository (25 mg total) rectally every 6 (six) hours as needed for nausea or vomiting. Patient not taking: Reported on 06/16/2019 10/03/13 11/07/19  Serena Colonel, MD    Family History Family History  Problem Relation Age of Onset   Healthy Mother    Healthy Father    Alcohol abuse Paternal Grandfather    Hypertension Paternal Grandfather     Social History Social History   Tobacco Use   Smoking status: Never   Smokeless tobacco: Never  Vaping Use   Vaping Use: Every day  Substance Use Topics   Alcohol use: Not Currently  Drug use: Yes    Types: Marijuana    Comment: occasionally     Allergies   Patient has no known allergies.   Review of Systems Review of Systems  Genitourinary:  Positive for vaginal discharge.     Physical Exam Triage Vital Signs ED Triage Vitals  Enc Vitals Group     BP 07/30/22 1749 129/85     Pulse Rate 07/30/22 1749 66     Resp 07/30/22 1749 16     Temp 07/30/22 1749 98.3 F (36.8 C)     Temp Source 07/30/22 1749 Oral     SpO2 07/30/22 1749 100 %     Weight --      Height --      Head Circumference --      Peak Flow --      Pain Score 07/30/22 1750 0     Pain Loc --      Pain Edu? --      Excl. in [place]? --    No data found.  Updated Vital Signs BP 129/85 (BP Location: Left Arm)   Pulse 66   Temp 98.3 F (36.8 C) (Oral)   Resp 16   LMP 07/16/2022 (Approximate)   SpO2 100%   Visual Acuity Right Eye  Distance:   Left Eye Distance:   Bilateral Distance:    Right Eye Near:   Left Eye Near:    Bilateral Near:     Physical Exam Vitals and nursing note reviewed.  Constitutional:      Appearance: Normal appearance.  HENT:     Head: Normocephalic and atraumatic.     Nose: Nose normal.  Eyes:     Pupils: Pupils are equal, round, and reactive to light.  Cardiovascular:     Rate and Rhythm: Normal rate and regular rhythm.     Heart sounds: Normal heart sounds.  Pulmonary:     Effort: Pulmonary effort is normal.     Breath sounds: Normal breath sounds.  Abdominal:     Palpations: Abdomen is soft.     Tenderness: There is no abdominal tenderness. There is no right CVA tenderness or left CVA tenderness.  Musculoskeletal:     Cervical back: Normal range of motion and neck supple. No rigidity.  Skin:    General: Skin is warm and dry.  Neurological:     General: No focal deficit present.     Mental Status: She is alert and oriented to person, place, and time.  Psychiatric:        Mood and Affect: Mood normal.        Behavior: Behavior normal.      UC Treatments / Results  Labs (all labs ordered are listed, but only abnormal results are displayed) Labs Reviewed  CERVICOVAGINAL ANCILLARY ONLY    EKG   Radiology No results found.  Procedures Procedures (including critical care time)  Medications Ordered in UC Medications - No data to display  Initial Impression / Assessment and Plan / UC Course  I have reviewed the triage vital signs and the nursing notes.  Pertinent labs & imaging results that were available during my care of the patient were reviewed by me and considered in my medical decision making (see chart for details).     Start Flagyl and Diflucan as prescribed Will contact for results of any positive testing done today Patient encouraged to follow-up with gynecology for further workup of her reoccurring symptoms ER precautions reviewed and patient  verbalized  understanding Final Clinical Impressions(s) / UC Diagnoses   Final diagnoses:  Acute vaginitis     Discharge Instructions      The clinic will contact you with results of the testing done today Flagyl twice daily for 7 days Diflucan as prescribed Follow-up with gynecology for further evaluation of your recurrent symptoms Please go to the ER if you have any worsening symptoms   ED Prescriptions     Medication Sig Dispense Auth. Provider   fluconazole (DIFLUCAN) 150 MG tablet Take 1 tablet (150 mg total) by mouth every 3 (three) days. 4 tablet Radford Pax, NP   metroNIDAZOLE (FLAGYL) 500 MG tablet Take 1 tablet (500 mg total) by mouth 2 (two) times daily. 14 tablet Radford Pax, NP      PDMP not reviewed this encounter.   Radford Pax, NP 07/30/22 [phone]

## 2022-07-31 LAB — CERVICOVAGINAL ANCILLARY ONLY
Bacterial Vaginitis (gardnerella): POSITIVE — AB
Candida Glabrata: NEGATIVE
Candida Vaginitis: NEGATIVE
Chlamydia: NEGATIVE
Comment: NEGATIVE
Comment: NEGATIVE
Comment: NEGATIVE
Comment: NEGATIVE
Comment: NEGATIVE
Comment: NORMAL
Neisseria Gonorrhea: NEGATIVE
Trichomonas: NEGATIVE

## 2022-08-26 ENCOUNTER — Ambulatory Visit: Payer: Self-pay

## 2022-08-27 ENCOUNTER — Ambulatory Visit: Payer: Self-pay

## 2022-08-27 ENCOUNTER — Ambulatory Visit
Admission: EM | Admit: 2022-08-27 | Discharge: 2022-08-27 | Disposition: A | Discharge status: Home / Self Care | Payer: No Typology Code available for payment source | Attending: Internal Medicine | Admitting: Internal Medicine

## 2022-08-27 DIAGNOSIS — N898 Other specified noninflammatory disorders of vagina: Secondary | ICD-10-CM | POA: Insufficient documentation

## 2022-08-27 LAB — POCT URINALYSIS DIP (MANUAL ENTRY)
Bilirubin, UA: NEGATIVE
Blood, UA: NEGATIVE
Glucose, UA: NEGATIVE mg/dL
Ketones, POC UA: NEGATIVE mg/dL
Leukocytes, UA: NEGATIVE
Nitrite, UA: NEGATIVE
Protein Ur, POC: NEGATIVE mg/dL
Spec Grav, UA: >=1.03 — AB (ref 1.010–1.025)
Urobilinogen, UA: 0.2 E.U./dL
pH, UA: 6 (ref 5.0–8.0)

## 2022-08-27 LAB — POCT URINE PREGNANCY: Preg Test, Ur: NEGATIVE

## 2022-08-27 MED ORDER — FLUCONAZOLE 150 MG PO TABS: 150.0000 mg | ORAL_TABLET | ORAL | 0 refills | Status: AC

## 2022-08-27 MED ORDER — METRONIDAZOLE 500 MG PO TABS: 500.0000 mg | ORAL_TABLET | Freq: Two times a day (BID) | ORAL | 0 refills | Status: AC

## 2022-08-27 NOTE — ED Triage Notes (Signed)
Pt c/o white vaginal discharge with foul odor. Pt reports she is prone to BV and yeast infections. Tested positive for BV on 4/24, and completed 7-day tx. Reports symptoms did not fully resolve, but have increased over the last week.

## 2022-08-27 NOTE — Discharge Instructions (Addendum)
Your swab were sent to the lab for further testing.  You will be called with results. Metronidazole is an antibiotic given to treat vaginal infections. You were also prescribed Fluconazole for yeast infections. Take the prescriptions as directed.  You should avoid all sexual activity until you have been notified of all your results and have undergone any necessary treatment.  If you are positive, it is recommended that you inform all sexual partners so they can treat be treated as well before having sex again.   Recommend you follow up with GYN as soon as you can.

## 2022-08-27 NOTE — ED Provider Notes (Signed)
[facility]  Note:  This document was prepared using Dragon voice recognition software and may include unintentional dictation errors.  MRN: [id number] DOB: 01-27-1996 DATE: 08/27/22   Subjective:  Chief Complaint:  Chief Complaint  Patient presents with   Vaginal Discharge    HPI: Shelly Mccann is a 27 y.o. female presenting for vaginal discharge for about one week. Patient has a history of frequent vaginal infections and yeast infections. She is waiting for her insurance to be updated prior to following up with GYN. She states she was told to avoid tampons and use only pads and was doing so for awhile. However, a month ago she had an abortion and was unable to wear a pad to work. She states she had to wear tampons again and she believes that is what lead to her current flare. She reports a "cottage cheese" like discharge with a foul odor. She has some vaginal irritation, but no pain or pruritus. Denies fever, nausea/vomiting, abdominal pain, dysuria, vaginal lesions. Endorses vaginal discharge and vaginal odor. Presents NAD.  Prior to Admission medications   Medication Sig Start Date End Date Taking? Authorizing Provider  fluconazole (DIFLUCAN) 150 MG tablet Take 1 tablet (150 mg total) by mouth every 3 (three) days for 3 doses. 08/27/22 09/03/22 Yes Rogerick Baldwin P, PA-C  metroNIDAZOLE (FLAGYL) 500 MG tablet Take 1 tablet (500 mg total) by mouth every 12 (twelve) hours for 7 days. 08/27/22 09/03/22 Yes Etosha Wetherell P, PA-C  promethazine (PHENERGAN) 25 MG suppository Place 1 suppository (25 mg total) rectally every 6 (six) hours as needed for nausea or vomiting. Patient not taking: Reported on 06/16/2019 10/03/13 11/07/19  Serena Colonel, MD     No Known Allergies  History:   History reviewed. No pertinent past medical history.   Past Surgical History:  Procedure Laterality Date   TONSILLECTOMY N/A 10/03/2013   Procedure: TONSILLECTOMY;  Surgeon: Serena Colonel, MD;  Location:  [facility];  Service: ENT;  Laterality: N/A;    Family History  Problem Relation Age of Onset   Healthy Mother    Healthy Father    Alcohol abuse Paternal Grandfather    Hypertension Paternal Grandfather     Social History   Tobacco Use   Smoking status: Never   Smokeless tobacco: Never  Vaping Use   Vaping Use: Every day  Substance Use Topics   Alcohol use: Not Currently   Drug use: Yes    Types: Marijuana    Comment: occasionally    Review of Systems  Constitutional:  Negative for fever.  Gastrointestinal:  Negative for abdominal pain, nausea and vomiting.  Genitourinary:  Positive for vaginal discharge. Negative for dysuria, flank pain, genital sores, vaginal bleeding and vaginal pain.  Musculoskeletal:  Negative for back pain.     Objective:   Vitals: BP 131/81 (BP Location: Right Arm)   Pulse 71   Temp (!) 97.5 F (36.4 C) (Oral)   Resp 18   LMP 07/16/2022 (Approximate)   SpO2 96%   Physical Exam Constitutional:      General: She is not in acute distress.    Appearance: Normal appearance. She is well-developed and normal weight. She is not ill-appearing or toxic-appearing.  HENT:     Head: Normocephalic and atraumatic.  Cardiovascular:     Rate and Rhythm: Normal rate and regular rhythm.     Heart sounds: Normal heart sounds.  Pulmonary:     Effort: Pulmonary effort is normal.  Breath sounds: Normal breath sounds.     Comments: Clear to auscultation bilaterally Abdominal:     General: Bowel sounds are normal.     Palpations: Abdomen is soft.     Tenderness: There is no abdominal tenderness. There is no right CVA tenderness or left CVA tenderness.  Skin:    General: Skin is warm and dry.  Neurological:     General: No focal deficit present.     Mental Status: She is alert.  Psychiatric:        Mood and Affect: Mood and affect normal.     Results:  Labs: Results for orders placed or performed during the hospital encounter of  08/27/22 (from the past 24 hour(s))  POCT urine pregnancy     Status: None   Collection Time: 08/27/22  5:34 PM  Result Value Ref Range   Preg Test, Ur Negative Negative  POCT urinalysis dipstick     Status: Abnormal   Collection Time: 08/27/22  5:34 PM  Result Value Ref Range   Color, UA yellow yellow   Clarity, UA clear clear   Glucose, UA negative negative mg/dL   Bilirubin, UA negative negative   Ketones, POC UA negative negative mg/dL   Spec Grav, UA >=1.610 (A) 1.010 - 1.025   Blood, UA negative negative   pH, UA 6.0 5.0 - 8.0   Protein Ur, POC negative negative mg/dL   Urobilinogen, UA 0.2 0.2 or 1.0 E.U./dL   Nitrite, UA Negative Negative   Leukocytes, UA Negative Negative    Radiology: No results found.   UC Course/Treatments:  Procedures: Procedures   Medications Ordered in UC: Medications - No data to display   Assessment and Plan :     ICD-10-CM   1. Vaginal discharge  N89.8     2. Vaginal odor  N89.8      Vaginal Discharge: Afebrile, nontoxic-appearing, NAD. VSS. DDX includes but not limited to: BV, candidiasis, gonorrhea, chlamydia  UA and UPT were negative today in office. Cytology is pending. Given frequent vaginal infections and yeast infections as well as her current symptoms, Flagyl 500mg  BID and Diflucan 150mg  q72 hours were prescribed to empirically treat both BV and candidiasis. Recommend she follow up with GYN as soon as possible. Safe sex precautions advised. Strict ED precautions were given and patient verbalized understanding.  Vaginal Odor: Suspect secondary to BV. Flagyl 500mg  BID was prescribed. See notes above.   ED Discharge Orders          Ordered    metroNIDAZOLE (FLAGYL) 500 MG tablet  Every 12 hours        08/27/22 1736    fluconazole (DIFLUCAN) 150 MG tablet  every 72 hours        08/27/22 1736             PDMP not reviewed this encounter.     Cynda Acres, PA-C 08/27/22 1745

## 2022-08-28 LAB — CERVICOVAGINAL ANCILLARY ONLY
Bacterial Vaginitis (gardnerella): POSITIVE — AB
Candida Glabrata: NEGATIVE
Candida Vaginitis: NEGATIVE
Chlamydia: NEGATIVE
Comment: NEGATIVE
Comment: NEGATIVE
Comment: NEGATIVE
Comment: NEGATIVE
Comment: NEGATIVE
Comment: NORMAL
Neisseria Gonorrhea: NEGATIVE
Trichomonas: NEGATIVE

## 2022-08-28 NOTE — Progress Notes (Signed)
TC to pt, ID verified. Advised of lab result positive for BV and pt treated appropriately with Metronidazole during visit per protocol. Pt denies any add'l concerns/complaints at this time.

## 2022-10-02 ENCOUNTER — Ambulatory Visit
Admission: EM | Admit: 2022-10-02 | Discharge: 2022-10-02 | Disposition: A | Discharge status: Home / Self Care | Payer: No Typology Code available for payment source | Attending: Emergency Medicine | Admitting: Emergency Medicine

## 2022-10-02 DIAGNOSIS — Z20822 Contact with and (suspected) exposure to covid-19: Secondary | ICD-10-CM | POA: Insufficient documentation

## 2022-10-02 DIAGNOSIS — B9789 Other viral agents as the cause of diseases classified elsewhere: Secondary | ICD-10-CM | POA: Insufficient documentation

## 2022-10-02 DIAGNOSIS — J0110 Acute frontal sinusitis, unspecified: Secondary | ICD-10-CM | POA: Insufficient documentation

## 2022-10-02 DIAGNOSIS — J988 Other specified respiratory disorders: Secondary | ICD-10-CM | POA: Insufficient documentation

## 2022-10-02 MED ORDER — FLUTICASONE PROPIONATE 50 MCG/ACT NA SUSP: 2.0000 | Freq: Every day | NASAL | 0 refills | Status: DC

## 2022-10-02 MED ORDER — IBUPROFEN 600 MG PO TABS: 600.0000 mg | ORAL_TABLET | Freq: Three times a day (TID) | ORAL | 0 refills | Status: DC | PRN

## 2022-10-02 MED ORDER — PROMETHAZINE-DM 6.25-15 MG/5ML PO SYRP: 5.0000 mL | ORAL_SOLUTION | Freq: Four times a day (QID) | ORAL | 0 refills | Status: DC | PRN

## 2022-10-02 NOTE — ED Provider Notes (Signed)
HPI  SUBJECTIVE:  Shelly Mccann is a 27 y.o. female who presents with 4 days of sore throat, nasal congestion, body aches, chills, yellow rhinorrhea, sinus pain and pressure, postnasal drip.  She reports cough productive of the same materials were nasal congestion starting yesterday.  Some diarrhea, and cervical lymphadenopathy.  She reports wheezing and night and is unable to sleep because of the cough.  She reports headache for the first few days, but this is largely resolved.  No facial swelling, upper dental pain, shortness of breath, nausea, vomiting, abdominal pain.  She has been exposed to COVID.  No known flu exposure.  No allergy symptoms.  She took DayQuil within 6 hours of evaluation with improvement in her symptoms.  Symptoms are worse at night.  She is status post tonsillectomy and had COVID 6 months ago.  LMP: June 1.  Denies possibility of pregnancy.  PCP: None.    History reviewed. No pertinent past medical history.  Past Surgical History:  Procedure Laterality Date   TONSILLECTOMY N/A 10/03/2013   Procedure: TONSILLECTOMY;  Surgeon: Serena Colonel, MD;  Location: [facility];  Service: ENT;  Laterality: N/A;    Family History  Problem Relation Age of Onset   Healthy Mother    Healthy Father    Alcohol abuse Paternal Grandfather    Hypertension Paternal Grandfather     Social History   Tobacco Use   Smoking status: Never   Smokeless tobacco: Never  Vaping Use   Vaping Use: Every day  Substance Use Topics   Alcohol use: Not Currently   Drug use: Yes    Types: Marijuana    Comment: occasionally    No current facility-administered medications for this encounter.  Current Outpatient Medications:    fluticasone (FLONASE) 50 MCG/ACT nasal spray, Place 2 sprays into both nostrils daily., Disp: 16 g, Rfl: 0   ibuprofen (ADVIL) 600 MG tablet, Take 1 tablet (600 mg total) by mouth every 8 (eight) hours as needed., Disp: 30 tablet, Rfl: 0    promethazine-dextromethorphan (PROMETHAZINE-DM) 6.25-15 MG/5ML syrup, Take 5 mLs by mouth 4 (four) times daily as needed for cough., Disp: 118 mL, Rfl: 0  No Known Allergies   ROS  As noted in HPI.   Physical Exam  BP 129/88 (BP Location: Right Arm)   Pulse 70   Temp 98 F (36.7 C) (Oral)   Resp 14   LMP 09/06/2022 (Exact Date)   SpO2 99%   Constitutional: Well developed, well nourished, no acute distress Eyes:  EOMI, conjunctiva normal bilaterally HENT: Normocephalic, atraumatic,mucus membranes moist.  Erythematous, swollen turbinates.  Purulent nasal congestion.  Positive frontal sinus tenderness.  No maxillary sinus tenderness.  Tonsils surgically absent.  Erythematous oropharynx.  Positive cobblestoning and postnasal drip. Neck: No cervical lymphadenopathy Respiratory: Normal inspiratory effort, lungs clear bilaterally.  Positive anterior chest wall tenderness Cardiovascular: Normal rate, regular rhythm, no murmurs rubs or gallops GI: nondistended skin: No rash, skin intact Musculoskeletal: no deformities Neurologic: Alert & oriented x 3, no focal neuro deficits Psychiatric: Speech and behavior appropriate   ED Course   Medications - No data to display  Orders Placed This Encounter  Procedures   SARS CORONAVIRUS 2 (TAT 6-24 HRS) Anterior Nasal Swab    Standing Status:   Standing    Number of Occurrences:   1    Results for orders placed or performed during the hospital encounter of 10/02/22 (from the past 24 hour(s))  SARS CORONAVIRUS 2 (TAT 6-24  HRS) Anterior Nasal Swab     Status: None   Collection Time: 10/02/22  4:21 PM   Specimen: Anterior Nasal Swab  Result Value Ref Range   SARS Coronavirus 2 NEGATIVE NEGATIVE   No results found.  ED Clinical Impression  1. Viral respiratory infection   2. Acute non-recurrent frontal sinusitis   3. Encounter for laboratory testing for COVID-19 virus      ED Assessment/Plan    Patient presents with a viral  respiratory infection/frontal sinusitis.  Checking for COVID although patient is not a candidate for antivirals.  Will send home with Flonase, Mucinex D, saline nasal irrigation, Promethazine DM, ibuprofen/Tylenol 3 times a day.  If she gets worse or is sick for more than 10 days, then may return here and we can consider antibiotics for a sinus infection.  Will refer to [place].  COVID-negative.  Plan as above.  Discussed labs, MDM, treatment plan, and plan for follow-up with patient.  patient agrees with plan.   Meds ordered this encounter  Medications   fluticasone (FLONASE) 50 MCG/ACT nasal spray    Sig: Place 2 sprays into both nostrils daily.    Dispense:  16 g    Refill:  0   ibuprofen (ADVIL) 600 MG tablet    Sig: Take 1 tablet (600 mg total) by mouth every 8 (eight) hours as needed.    Dispense:  30 tablet    Refill:  0   promethazine-dextromethorphan (PROMETHAZINE-DM) 6.25-15 MG/5ML syrup    Sig: Take 5 mLs by mouth 4 (four) times daily as needed for cough.    Dispense:  118 mL    Refill:  0      *This clinic note was created using Scientist, clinical (histocompatibility and immunogenetics). Therefore, there may be occasional mistakes despite careful proofreading.  ?    Domenick Gong, MD 10/03/22 1022

## 2022-10-02 NOTE — Discharge Instructions (Signed)
Your COVID will take 6 to 24 hours to come back.  If it is positive, you will need to wear a mask for 5 days after onset of symptoms.  Mucinex D, Flonase, saline nasal irrigation with a NeilMed rinse and distilled water as often as you want to help clear your sinuses of any infection.  Flonase will help with the postnasal drip.  600 mg of ibuprofen by 1000 mg of Tylenol 3 times a day as needed for body aches, headaches.  Promethazine DM for cough.  If you start to get worse, or are not better in 5 days, return here we can consider antibiotics for sinus infection at that time.

## 2022-10-02 NOTE — ED Triage Notes (Signed)
Pt c/o nasal congestion, sore throat x 4 days. Onset Monday and as the day progressed pt noticed more sx's body aches chills sweats, headache.

## 2022-10-03 LAB — SARS CORONAVIRUS 2 (TAT 6-24 HRS): SARS Coronavirus 2: NEGATIVE

## 2022-10-06 ENCOUNTER — Ambulatory Visit: Payer: Self-pay

## 2022-10-13 ENCOUNTER — Ambulatory Visit
Admission: RE | Admit: 2022-10-13 | Discharge: 2022-10-13 | Disposition: A | Discharge status: Home / Self Care | Payer: Self-pay | Source: Ambulatory Visit | Attending: Family Medicine | Admitting: Family Medicine

## 2022-10-13 VITALS — BP 124/83 | HR 60 | Temp 98.2°F | Resp 16

## 2022-10-13 DIAGNOSIS — Z202 Contact with and (suspected) exposure to infections with a predominantly sexual mode of transmission: Secondary | ICD-10-CM | POA: Insufficient documentation

## 2022-10-13 DIAGNOSIS — N898 Other specified noninflammatory disorders of vagina: Secondary | ICD-10-CM | POA: Insufficient documentation

## 2022-10-13 MED ORDER — FLUCONAZOLE 200 MG PO TABS: ORAL_TABLET | ORAL | 0 refills | Status: DC

## 2022-10-13 MED ORDER — METRONIDAZOLE 500 MG PO TABS: 500.0000 mg | ORAL_TABLET | Freq: Two times a day (BID) | ORAL | 0 refills | Status: DC

## 2022-10-13 NOTE — ED Provider Notes (Signed)
Shelly Mccann    CSN: [id number] Arrival date & time: 10/13/22  1636      History   Chief Complaint Chief Complaint  Patient presents with   Vaginal Itching    Yeast infection and BV. Odor and discomfort - Entered by patient    HPI Shelly Mccann is a 27 y.o. female.   HPI 27 year old female presents with concern for STD, possible yeast infection with bacterial vaginosis.  Patient reports odor and discomfort of the vaginal area for 3-4 days.  PMH significant for obesity and recurrent vaginitis and bacterial vaginosis.  Patient reports is currently not followed by PCP and is unable to be evaluated by GYN because she has no insurance.  History reviewed. No pertinent past medical history.  Patient Active Problem List   Diagnosis Date Noted   S/P tonsillectomy 10/03/2013    Past Surgical History:  Procedure Laterality Date   TONSILLECTOMY N/A 10/03/2013   Procedure: TONSILLECTOMY;  Surgeon: Serena Colonel, MD;  Location: [facility];  Service: ENT;  Laterality: N/A;    OB History     Gravida  4   Para      Term      Preterm      AB  2   Living         SAB  1   IAB  1   Ectopic      Multiple      Live Births               Home Medications    Prior to Admission medications   Medication Sig Start Date End Date Taking? Authorizing Provider  fluconazole (DIFLUCAN) 200 MG tablet Take 1 tablet p.o. now may repeat 1 tablet p.o. in 3 days if symptoms or not completely resolved. 10/13/22  Yes Trevor Iha, FNP  metroNIDAZOLE (FLAGYL) 500 MG tablet Take 1 tablet (500 mg total) by mouth 2 (two) times daily. 10/13/22  Yes Trevor Iha, FNP  fluticasone (FLONASE) 50 MCG/ACT nasal spray Place 2 sprays into both nostrils daily. 10/02/22   Domenick Gong, MD  ibuprofen (ADVIL) 600 MG tablet Take 1 tablet (600 mg total) by mouth every 8 (eight) hours as needed. 10/02/22   Domenick Gong, MD  promethazine-dextromethorphan (PROMETHAZINE-DM)  6.25-15 MG/5ML syrup Take 5 mLs by mouth 4 (four) times daily as needed for cough. 10/02/22   Domenick Gong, MD  promethazine (PHENERGAN) 25 MG suppository Place 1 suppository (25 mg total) rectally every 6 (six) hours as needed for nausea or vomiting. Patient not taking: Reported on 06/16/2019 10/03/13 11/07/19  Serena Colonel, MD    Family History Family History  Problem Relation Age of Onset   Healthy Mother    Healthy Father    Alcohol abuse Paternal Grandfather    Hypertension Paternal Grandfather     Social History Social History   Tobacco Use   Smoking status: Never   Smokeless tobacco: Never  Vaping Use   Vaping Use: Every day  Substance Use Topics   Alcohol use: Not Currently   Drug use: Yes    Types: Marijuana    Comment: occasionally     Allergies   Patient has no known allergies.   Review of Systems Review of Systems  Genitourinary:  Positive for vaginal discharge.       Vaginal itching x 3-4 days  All other systems reviewed and are negative.    Physical Exam Triage Vital Signs ED Triage Vitals  Enc Vitals Group  BP 10/13/22 1645 124/83     Pulse Rate 10/13/22 1645 60     Resp 10/13/22 1645 16     Temp 10/13/22 1645 98.2 F (36.8 C)     Temp Source 10/13/22 1645 Oral     SpO2 10/13/22 1645 98 %     Weight --      Height --      Head Circumference --      Peak Flow --      Pain Score 10/13/22 1657 0     Pain Loc --      Pain Edu? --      Excl. in GC? --    No data found.  Updated Vital Signs BP 124/83 (BP Location: Right Arm)   Pulse 60   Temp 98.2 F (36.8 C) (Oral)   Resp 16   LMP 09/13/2022 (Exact Date)   SpO2 98%       Physical Exam Vitals and nursing note reviewed.  Constitutional:      Appearance: Normal appearance. She is normal weight.  HENT:     Head: Normocephalic and atraumatic.     Mouth/Throat:     Mouth: Mucous membranes are moist.     Pharynx: Oropharynx is clear.  Eyes:     Extraocular Movements:  Extraocular movements intact.     Conjunctiva/sclera: Conjunctivae normal.     Pupils: Pupils are equal, round, and reactive to light.  Cardiovascular:     Rate and Rhythm: Normal rate and regular rhythm.     Pulses: Normal pulses.     Heart sounds: Normal heart sounds.  Pulmonary:     Effort: Pulmonary effort is normal.     Breath sounds: Normal breath sounds. No wheezing, rhonchi or rales.  Abdominal:     Tenderness: There is no right CVA tenderness or left CVA tenderness.  Musculoskeletal:        General: Normal range of motion.  Skin:    General: Skin is warm and dry.  Neurological:     General: No focal deficit present.     Mental Status: She is alert and oriented to person, place, and time. Mental status is at baseline.  Psychiatric:        Mood and Affect: Mood normal.        Behavior: Behavior normal.      Mccann Treatments / Results  Labs (all labs ordered are listed, but only abnormal results are displayed) Labs Reviewed  CERVICOVAGINAL ANCILLARY ONLY    EKG   Radiology No results found.  Procedures Procedures (including critical care time)  Medications Ordered in Mccann Medications - No data to display  Initial Impression / Assessment and Plan / Mccann Course  I have reviewed the triage vital signs and the nursing notes.  Pertinent labs & imaging results that were available during my care of the patient were reviewed by me and considered in my medical decision making (see chart for details).     MDM: 1. Potential exposure to STD-Aptima swab ordered; 2.  Vaginal discharge-Aptima swab ordered Rx'd Flagyl 500 mg tablet twice daily x 7 days; 3.  Vaginal itching-Aptima swab ordered,Rx'd Diflucan 200 mg tablet: Take 1 tablet now may repeat 1 tablet. in 3 days if symptoms are not completely resolved. Instructed patient to take medication as directed with food to completion.  Encouraged increase daily water intake to 64 ounces per day while taking these medications.   Advised patient we will follow-up with Aptima swab results once  received.  Patient to establish with PCP/GYN for further evaluation of recurrent vaginitis.  Contact information provided on this AVS. patient discharged home, hemodynamically stable.   Final Clinical Impressions(s) / Mccann Diagnoses   Final diagnoses:  Potential exposure to STD  Vaginal discharge  Vaginal itching     Discharge Instructions      Instructed patient to take medication as directed with food to completion.  Encouraged increase daily water intake to 64 ounces per day while taking these medications.  Advised patient we will follow-up with Aptima swab results once received.  Patient to establish with PCP/GYN for further evaluation of recurrent vaginitis.  Contact information provided on this AVS.     ED Prescriptions     Medication Sig Dispense Auth. Provider   fluconazole (DIFLUCAN) 200 MG tablet Take 1 tablet p.o. now may repeat 1 tablet p.o. in 3 days if symptoms or not completely resolved. 7 tablet Trevor Iha, FNP   metroNIDAZOLE (FLAGYL) 500 MG tablet Take 1 tablet (500 mg total) by mouth 2 (two) times daily. 14 tablet Trevor Iha, FNP      PDMP not reviewed this encounter.   Trevor Iha, FNP 10/13/22 [phone]

## 2022-10-13 NOTE — ED Triage Notes (Addendum)
Patient presents to UC for possible yeast or BV. STD testing no blood work. Reports odor and vaginal discomfort. Hx of BV. No OB-GYN or PCP.

## 2022-10-13 NOTE — Discharge Instructions (Addendum)
Instructed patient to take medication as directed with food to completion.  Encouraged increase daily water intake to 64 ounces per day while taking these medications.  Advised patient we will follow-up with Aptima swab results once received.  Patient to establish with PCP/GYN for further evaluation of recurrent vaginitis.  Contact information provided on this AVS.

## 2022-10-14 LAB — CERVICOVAGINAL ANCILLARY ONLY
Bacterial Vaginitis (gardnerella): POSITIVE — AB
Candida Glabrata: NEGATIVE
Candida Vaginitis: POSITIVE — AB
Chlamydia: NEGATIVE
Comment: NEGATIVE
Comment: NEGATIVE
Comment: NEGATIVE
Comment: NEGATIVE
Comment: NEGATIVE
Comment: NORMAL
Neisseria Gonorrhea: NEGATIVE
Trichomonas: NEGATIVE

## 2022-11-18 ENCOUNTER — Ambulatory Visit: Payer: Self-pay

## 2022-11-19 ENCOUNTER — Ambulatory Visit: Payer: Self-pay

## 2022-11-20 ENCOUNTER — Ambulatory Visit: Payer: Self-pay

## 2022-11-21 ENCOUNTER — Ambulatory Visit: Payer: Self-pay

## 2022-11-22 ENCOUNTER — Ambulatory Visit: Payer: Self-pay

## 2022-11-25 ENCOUNTER — Ambulatory Visit
Admission: RE | Admit: 2022-11-25 | Discharge: 2022-11-25 | Disposition: A | Discharge status: Home / Self Care | Payer: Self-pay | Source: Ambulatory Visit | Attending: Family Medicine | Admitting: Family Medicine

## 2022-11-25 VITALS — BP 123/80 | HR 56 | Temp 97.9°F | Resp 13

## 2022-11-25 DIAGNOSIS — N760 Acute vaginitis: Secondary | ICD-10-CM

## 2022-11-25 DIAGNOSIS — N390 Urinary tract infection, site not specified: Secondary | ICD-10-CM

## 2022-11-25 LAB — POCT URINALYSIS DIP (MANUAL ENTRY)
Bilirubin, UA: NEGATIVE
Glucose, UA: NEGATIVE mg/dL
Ketones, POC UA: NEGATIVE mg/dL
Nitrite, UA: NEGATIVE
Protein Ur, POC: 30 mg/dL — AB
Spec Grav, UA: >=1.03 — AB (ref 1.010–1.025)
Urobilinogen, UA: 0.2 E.U./dL
pH, UA: 6 (ref 5.0–8.0)

## 2022-11-25 MED ORDER — CEPHALEXIN 500 MG PO CAPS: 500.0000 mg | ORAL_CAPSULE | Freq: Two times a day (BID) | ORAL | 0 refills | Status: DC

## 2022-11-25 MED ORDER — METRONIDAZOLE 500 MG PO TABS: 500.0000 mg | ORAL_TABLET | Freq: Two times a day (BID) | ORAL | 0 refills | Status: DC

## 2022-11-25 NOTE — ED Provider Notes (Signed)
[facility]    CSN: [id number] Arrival date & time: 11/25/22  1736      History   Chief Complaint Chief Complaint  Patient presents with   Vaginal Discharge    BV and UTI - Entered by patient    HPI Shelly Mccann is a 27 y.o. female.   Presenting today with about a week of suprapubic pressure, dysuria, urinary frequency, urine odor and a white vaginal discharge with irritation.  She denies fever, chills, nausea, vomiting, rashes or lesions, concern for STIs or pregnancy.  So far not sure anything over-the-counter for symptoms.  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  She would also like to be checked for a urinary tract infection.  LMP 11/11/2022.    History reviewed. No pertinent past medical history.  Patient Active Problem List   Diagnosis Date Noted   S/P tonsillectomy 10/03/2013    Past Surgical History:  Procedure Laterality Date   TONSILLECTOMY N/A 10/03/2013   Procedure: TONSILLECTOMY;  Surgeon: Serena Colonel, MD;  Location: [facility];  Service: ENT;  Laterality: N/A;    OB History     Gravida  4   Para      Term      Preterm      AB  2   Living         SAB  1   IAB  1   Ectopic      Multiple      Live Births               Home Medications    Prior to Admission medications   Medication Sig Start Date End Date Taking? Authorizing Provider  cephALEXin (KEFLEX) 500 MG capsule Take 1 capsule (500 mg total) by mouth 2 (two) times daily. 11/25/22  Yes Particia Nearing, PA-C  fluconazole (DIFLUCAN) 200 MG tablet Take 1 tablet p.o. now may repeat 1 tablet p.o. in 3 days if symptoms or not completely resolved. 10/13/22   Trevor Iha, FNP  fluticasone (FLONASE) 50 MCG/ACT nasal spray Place 2 sprays into both nostrils daily. 10/02/22   Domenick Gong, MD  ibuprofen (ADVIL) 600 MG tablet Take 1 tablet (600 mg total) by mouth every 8 (eight) hours as needed. 10/02/22   Domenick Gong, MD   metroNIDAZOLE (FLAGYL) 500 MG tablet Take 1 tablet (500 mg total) by mouth 2 (two) times daily. 11/25/22   Particia Nearing, PA-C  promethazine-dextromethorphan (PROMETHAZINE-DM) 6.25-15 MG/5ML syrup Take 5 mLs by mouth 4 (four) times daily as needed for cough. 10/02/22   Domenick Gong, MD  promethazine (PHENERGAN) 25 MG suppository Place 1 suppository (25 mg total) rectally every 6 (six) hours as needed for nausea or vomiting. Patient not taking: Reported on 06/16/2019 10/03/13 11/07/19  Serena Colonel, MD    Family History Family History  Problem Relation Age of Onset   Healthy Mother    Healthy Father    Alcohol abuse Paternal Grandfather    Hypertension Paternal Grandfather     Social History Social History   Tobacco Use   Smoking status: Never   Smokeless tobacco: Never  Vaping Use   Vaping status: Every Day  Substance Use Topics   Alcohol use: Not Currently   Drug use: Yes    Types: Marijuana    Comment: occasionally     Allergies   Patient has no known allergies.   Review of Systems Review of Systems PER HPI  Physical Exam Triage  Vital Signs ED Triage Vitals  Encounter Vitals Group     BP 11/25/22 1808 123/80     Systolic BP Percentile --      Diastolic BP Percentile --      Pulse Rate 11/25/22 1808 (!) 56     Resp 11/25/22 1808 13     Temp 11/25/22 1808 97.9 F (36.6 C)     Temp Source 11/25/22 1808 Oral     SpO2 11/25/22 1808 98 %     Weight --      Height --      Head Circumference --      Peak Flow --      Pain Score 11/25/22 1810 6     Pain Loc --      Pain Education --      Exclude from Growth Chart --    No data found.  Updated Vital Signs BP 123/80 (BP Location: Right Arm)   Pulse (!) 56   Temp 97.9 F (36.6 C) (Oral)   Resp 13   LMP 11/11/2022 (Exact Date)   SpO2 98%   Visual Acuity Right Eye Distance:   Left Eye Distance:   Bilateral Distance:    Right Eye Near:   Left Eye Near:    Bilateral Near:     Physical  Exam Vitals and nursing note reviewed.  Constitutional:      Appearance: Normal appearance. She is not ill-appearing.  HENT:     Head: Atraumatic.  Eyes:     Extraocular Movements: Extraocular movements intact.     Conjunctiva/sclera: Conjunctivae normal.  Cardiovascular:     Rate and Rhythm: Normal rate and regular rhythm.     Heart sounds: Normal heart sounds.  Pulmonary:     Effort: Pulmonary effort is normal.     Breath sounds: Normal breath sounds.  Abdominal:     General: Bowel sounds are normal. There is no distension.     Palpations: Abdomen is soft.     Tenderness: There is no abdominal tenderness. There is no right CVA tenderness, left CVA tenderness or guarding.  Genitourinary:    Comments: GU exam deferred, self swab performed Musculoskeletal:        General: Normal range of motion.     Cervical back: Normal range of motion and neck supple.  Skin:    General: Skin is warm and dry.  Neurological:     Mental Status: She is alert and oriented to person, place, and time.  Psychiatric:        Mood and Affect: Mood normal.        Thought Content: Thought content normal.        Judgment: Judgment normal.      UC Treatments / Results  Labs (all labs ordered are listed, but only abnormal results are displayed) Labs Reviewed  POCT URINALYSIS DIP (MANUAL ENTRY) - Abnormal; Notable for the following components:      Result Value   Clarity, UA cloudy (*)    Spec Grav, UA >=1.030 (*)    Blood, UA moderate (*)    Protein Ur, POC =30 (*)    Leukocytes, UA Large (3+) (*)    All other components within normal limits  URINE CULTURE  CERVICOVAGINAL ANCILLARY ONLY    EKG   Radiology No results found.  Procedures Procedures (including critical care time)  Medications Ordered in UC Medications - No data to display  Initial Impression / Assessment and Plan / UC Course  I  have reviewed the triage vital signs and the nursing notes.  Pertinent labs & imaging  results that were available during my care of the patient were reviewed by me and considered in my medical decision making (see chart for details).     As and exam reassuring, urinalysis with evidence of a possible UTI.  Treat with Keflex while waiting urine culture and adjust if needed.  Vaginal swab pending, she is requesting to start Flagyl given her recurrent BV history while awaiting results.  Adjust if needed based on these.  Return for worsening symptoms.  Supportive home care reviewed.  Final Clinical Impressions(s) / UC Diagnoses   Final diagnoses:  Acute lower UTI  Acute vaginitis   Discharge Instructions   None    ED Prescriptions     Medication Sig Dispense Auth. Provider   metroNIDAZOLE (FLAGYL) 500 MG tablet Take 1 tablet (500 mg total) by mouth 2 (two) times daily. 14 tablet Particia Nearing, [place]   cephALEXin (KEFLEX) 500 MG capsule Take 1 capsule (500 mg total) by mouth 2 (two) times daily. 10 capsule Particia Nearing, [place]      PDMP not reviewed this encounter.   Particia Nearing, [place] 11/25/22 1840

## 2022-11-25 NOTE — ED Triage Notes (Signed)
Pt c/o BV and UTI sx's, abdominal pain, burning with urination urinary frequency, odor, and milky white  discharge, hx's BV and Yeast.

## 2022-11-26 LAB — CERVICOVAGINAL ANCILLARY ONLY
Bacterial Vaginitis (gardnerella): POSITIVE — AB
Candida Glabrata: NEGATIVE
Candida Vaginitis: NEGATIVE
Comment: NEGATIVE
Comment: NEGATIVE
Comment: NEGATIVE

## 2022-11-27 LAB — URINE CULTURE: Culture: 50000 — AB

## 2023-02-24 ENCOUNTER — Ambulatory Visit: Payer: Medicaid Other | Admitting: Adult Health

## 2023-04-02 ENCOUNTER — Encounter: Payer: Self-pay | Admitting: Family Medicine

## 2023-04-02 ENCOUNTER — Telehealth: Payer: Self-pay | Admitting: Family Medicine

## 2023-04-02 NOTE — Telephone Encounter (Signed)
Patient calling LD with nausea and vomiting She was seen in this pregnancy at [facility] and is between practices and has not established locally.   She is currently [redacted] weeks pregnant  She reports exposure sick children over Christmas.   Started last night with nausea and vomiting. Reports her boyfriend started symptoms prior to her onset of symptoms. She has been vomiting and unable to keep down water and food since last night.   Recommended OTC medications and sent list of medications via MyChart. Recommended ER evaluation if unable to tolerate fluids Closest ER is [facility] and they should be able to help with fluids and prescription medications.  Federico Flake, MD

## 2023-04-03 ENCOUNTER — Emergency Department (HOSPITAL_COMMUNITY)
Admission: EM | Admit: 2023-04-03 | Discharge: 2023-04-03 | Discharge status: Against Medical Advice | Payer: Self-pay | Attending: Emergency Medicine | Admitting: Emergency Medicine

## 2023-04-03 ENCOUNTER — Other Ambulatory Visit: Payer: Self-pay

## 2023-04-03 ENCOUNTER — Encounter (HOSPITAL_COMMUNITY): Payer: Self-pay | Admitting: *Deleted

## 2023-04-03 DIAGNOSIS — R519 Headache, unspecified: Secondary | ICD-10-CM | POA: Insufficient documentation

## 2023-04-03 DIAGNOSIS — R1110 Vomiting, unspecified: Secondary | ICD-10-CM | POA: Insufficient documentation

## 2023-04-03 DIAGNOSIS — M7918 Myalgia, other site: Secondary | ICD-10-CM | POA: Insufficient documentation

## 2023-04-03 DIAGNOSIS — Z5321 Procedure and treatment not carried out due to patient leaving prior to being seen by health care provider: Secondary | ICD-10-CM | POA: Insufficient documentation

## 2023-04-03 DIAGNOSIS — Z20822 Contact with and (suspected) exposure to covid-19: Secondary | ICD-10-CM | POA: Insufficient documentation

## 2023-04-03 LAB — RESP PANEL BY RT-PCR (RSV, FLU A&B, COVID)  RVPGX2
Influenza A by PCR: POSITIVE — AB
Influenza B by PCR: NEGATIVE
Resp Syncytial Virus by PCR: NEGATIVE
SARS Coronavirus 2 by RT PCR: NEGATIVE

## 2023-04-03 NOTE — ED Triage Notes (Signed)
Pt with body aches, HA, + emesis for past 2 days. SO had a 'head cold"

## 2023-04-08 NOTE — L&D Delivery Note (Signed)
 Patient complete and pushing. SVD of viable female infant over intact perineum.  Infant delivered to mom's abdomen. Delayed cord clamping x 1 minute. Cord clamped x 2, cut. Spontaneous cry heard. Weight and Apgars pending.  Vagina inspected. No lacerations noted.   Following birth of Baby A, attempt made to find Baby B feet. She did not tolerate exams. Head in RUQ. AROM large amount of clear fluid.  FHR in the 80's. Attempt to find feet resulted in delivery of fetal arm. Pt. Unable to tolerate further attempts at vaginal delivery. Stat C-section called. See Op note for further details.

## 2023-04-29 ENCOUNTER — Telehealth: Payer: Self-pay | Admitting: *Deleted

## 2023-04-29 NOTE — Telephone Encounter (Signed)
Pt would like to transfer care. Records are available in Care Everywhere. Please advise.

## 2023-04-30 ENCOUNTER — Telehealth: Payer: Self-pay | Admitting: *Deleted

## 2023-04-30 NOTE — Telephone Encounter (Signed)
Pt is requesting a call about her transfer.

## 2023-04-30 NOTE — Telephone Encounter (Signed)
Patient is 20 weeks with twin gestation. Needs anatomy scan and new ob

## 2023-05-06 ENCOUNTER — Other Ambulatory Visit: Payer: Medicaid Other

## 2023-05-06 ENCOUNTER — Other Ambulatory Visit: Payer: Medicaid Other | Admitting: Radiology

## 2023-05-06 ENCOUNTER — Encounter: Payer: Medicaid Other | Admitting: Advanced Practice Midwife

## 2023-05-07 ENCOUNTER — Encounter: Payer: Self-pay | Admitting: *Deleted

## 2023-05-07 DIAGNOSIS — O0990 Supervision of high risk pregnancy, unspecified, unspecified trimester: Secondary | ICD-10-CM | POA: Insufficient documentation

## 2023-05-08 ENCOUNTER — Other Ambulatory Visit: Payer: Self-pay | Admitting: Obstetrics & Gynecology

## 2023-05-08 ENCOUNTER — Other Ambulatory Visit: Payer: Medicaid Other

## 2023-05-08 DIAGNOSIS — Z363 Encounter for antenatal screening for malformations: Secondary | ICD-10-CM

## 2023-05-11 ENCOUNTER — Other Ambulatory Visit: Payer: Self-pay | Admitting: Obstetrics & Gynecology

## 2023-05-11 ENCOUNTER — Ambulatory Visit: Payer: Medicaid Other | Admitting: *Deleted

## 2023-05-11 ENCOUNTER — Ambulatory Visit: Payer: 59 | Admitting: *Deleted

## 2023-05-11 ENCOUNTER — Ambulatory Visit (INDEPENDENT_AMBULATORY_CARE_PROVIDER_SITE_OTHER): Payer: 59 | Admitting: Women's Health

## 2023-05-11 ENCOUNTER — Encounter: Payer: Self-pay | Admitting: Women's Health

## 2023-05-11 ENCOUNTER — Other Ambulatory Visit: Payer: Medicaid Other | Admitting: Radiology

## 2023-05-11 ENCOUNTER — Ambulatory Visit (INDEPENDENT_AMBULATORY_CARE_PROVIDER_SITE_OTHER): Payer: Medicaid Other

## 2023-05-11 VITALS — BP 131/73 | HR 90 | Wt 223.0 lb

## 2023-05-11 DIAGNOSIS — O30049 Twin pregnancy, dichorionic/diamniotic, unspecified trimester: Secondary | ICD-10-CM | POA: Insufficient documentation

## 2023-05-11 DIAGNOSIS — O30002 Twin pregnancy, unspecified number of placenta and unspecified number of amniotic sacs, second trimester: Secondary | ICD-10-CM

## 2023-05-11 DIAGNOSIS — O0992 Supervision of high risk pregnancy, unspecified, second trimester: Secondary | ICD-10-CM | POA: Diagnosis not present

## 2023-05-11 DIAGNOSIS — Z362 Encounter for other antenatal screening follow-up: Secondary | ICD-10-CM

## 2023-05-11 DIAGNOSIS — Z1332 Encounter for screening for maternal depression: Secondary | ICD-10-CM

## 2023-05-11 DIAGNOSIS — Z3A21 21 weeks gestation of pregnancy: Secondary | ICD-10-CM

## 2023-05-11 DIAGNOSIS — Z363 Encounter for antenatal screening for malformations: Secondary | ICD-10-CM | POA: Diagnosis not present

## 2023-05-11 DIAGNOSIS — Z720 Tobacco use: Secondary | ICD-10-CM

## 2023-05-11 DIAGNOSIS — Z31430 Encounter of female for testing for genetic disease carrier status for procreative management: Secondary | ICD-10-CM

## 2023-05-11 DIAGNOSIS — O30042 Twin pregnancy, dichorionic/diamniotic, second trimester: Secondary | ICD-10-CM

## 2023-05-11 DIAGNOSIS — Z3482 Encounter for supervision of other normal pregnancy, second trimester: Secondary | ICD-10-CM

## 2023-05-11 DIAGNOSIS — F1290 Cannabis use, unspecified, uncomplicated: Secondary | ICD-10-CM

## 2023-05-11 MED ORDER — ASPIRIN 81 MG PO TBEC: 81.0000 mg | DELAYED_RELEASE_TABLET | Freq: Every day | ORAL | 3 refills | Status: DC

## 2023-05-11 MED ORDER — PROMETHAZINE HCL 25 MG PO TABS: 12.5000 mg | ORAL_TABLET | Freq: Four times a day (QID) | ORAL | 6 refills | Status: DC | PRN

## 2023-05-11 NOTE — Progress Notes (Signed)
INITIAL OBSTETRICAL VISIT Patient name: Shelly Mccann MRN [id number]  Date of birth: [birth year] Chief Complaint:   Initial Prenatal Visit  History of Present Illness:   Shelly Mccann is a 28 y.o. G76P0030 African-American female at [redacted]w[redacted]d by LMP c/w u/s at 11 weeks with an Estimated Date of Delivery: 09/16/23 being seen today for her initial obstetrical visit.   Patient's last menstrual period was 12/10/2022. Her obstetrical history is significant for  SAB x 1, EAB x 2 .   Today she reports  not taking pnv right now, was in beginning. Did take medicine earlier in pregnancy to terminate, however changed her mind and was rx'd prometrium, which she has since stopped . Smokes THC to help w/ nausea, ability to eat. 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 Results were: NILM w/ HRHPV not done     05/11/2023   11:09 AM 08/13/2020    4:08 PM  Depression screen PHQ 2/9  Decreased Interest 1 0  Down, Depressed, Hopeless 0 0  PHQ - 2 Score 1 0  Altered sleeping 2 0  Tired, decreased energy 1 0  Change in appetite 0 1  Feeling bad or failure about yourself  0 1  Trouble concentrating 0 0  Moving slowly or fidgety/restless 0 0  Suicidal thoughts 0 0  PHQ-9 Score 4 2        05/11/2023   11:09 AM 08/13/2020    4:08 PM  GAD 7 : Generalized Anxiety Score  Nervous, Anxious, on Edge 0 1  Control/stop worrying 0 0  Worry too much - different things 0 1  Trouble relaxing 0 0  Restless 0 0  Easily annoyed or irritable 2 1  Afraid - awful might happen 0 0  Total GAD 7 Score 2 3     Review of Systems:   Pertinent items are noted in HPI Denies cramping/contractions, leakage of fluid, vaginal bleeding, abnormal vaginal discharge w/ itching/odor/irritation, headaches, visual changes, shortness of breath, chest pain, abdominal pain, severe nausea/vomiting, or  problems with urination or bowel movements unless otherwise stated above.  Pertinent History Reviewed:  Reviewed past medical,surgical, social, obstetrical and family history.  Reviewed problem list, medications and allergies. OB History  Gravida Para Term Preterm AB Living  4    3 0  SAB IAB Ectopic Multiple Live Births  1 2       # Outcome Date GA Lbr Len/2nd Weight Sex Type Anes PTL Lv  4 Current           3 IAB           2 IAB           1 SAB            Physical Assessment:   Vitals:   05/11/23 1103  BP: 131/73  Pulse: 90  Weight: 223 lb (101.2 kg)  Body mass index is 32.93 kg/m.       Physical Examination:  General appearance - well appearing, and in no distress  Mental status - alert, oriented to person, place, and time  Psych:  She has a normal mood and affect  Skin - warm and dry, normal color, no suspicious lesions noted  Chest - effort normal, all lung fields clear to auscultation bilaterally  Heart - normal rate  and regular rhythm  Abdomen - soft, nontender  Extremities:  No swelling or varicosities noted  Thin prep pap is not done   Chaperone: N/A    TODAY'S ANATOMY U/S: [place] 21+5 wks,DC/DA TWINS,CX 3.9 cm,normal ovaries BABY A female: cephalic left,posterior placenta gr 0,SVP of fluid 7.5 cm,FHR 146 bpm,EFW 453 g 48% BABY B female: cephalic right,posterior placenta gr 0,LVEICF,limited view of spine because of fetal position,need additional images of C/T spine,SVP of fluid 5.3 cm,FHR 159 bpm,EFW 601 g 99%,discordance 25%  No results found for this or any previous visit (from the past 24 hours).  Assessment & Plan:  1) High-Risk Pregnancy G4P0030 at [redacted]w[redacted]d with an Estimated Date of Delivery: 09/16/23   2) Initial OB visit  3) DCDA twins w/ 25% discordance> discussed w/ Dr. Charlotta Newton, repeat u/s in 3wks  4) Late care  5) Nausea> rx phenergan  6) THC use> to help w/ nausea/eating, advised cessation-discussed potential long term learning disabilities in babies,  plans to quit, hopefully phenergan will also help  7) Vapes> has cut back a lot, advised complete cessation  8) Fetus B w/ LVEICF> LR NIPS, discussed and gave printed info  Meds:  Meds ordered this encounter  Medications   aspirin EC 81 MG tablet    Sig: Take 1 tablet (81 mg total) by mouth daily. Swallow whole.    Dispense:  90 tablet    Refill:  3   promethazine (PHENERGAN) 25 MG tablet    Sig: Take 0.5-1 tablets (12.5-25 mg total) by mouth every 6 (six) hours as needed.    Dispense:  30 tablet    Refill:  6   Initial labs obtained Start prenatal vitamins asap Reviewed ptl s/s, reasons to seek care Reviewed recommended weight gain based on pre-gravid BMI Encouraged well-balanced diet Genetic & carrier screening discussed: requests Horizon  & AFP, had neg MaterniT21 earlier, too late for NT/IT Ultrasound discussed; fetal survey: results reviewed CCNC completed> form faxed if has or is planning to apply for medicaid The nature of [facility] [employer] with multiple MDs and other Advanced Practice Providers was explained to patient; also emphasized that fellows, residents, and students are part of our team. Does have home bp cuff. Office bp cuff given: no. Rx sent: n/a. Check bp weekly, let us know if consistently >140/90.   Indications for ASA therapy (per uptodate) One of the following: Multifetal gestation Yes  Follow-up: Return in about 3 weeks (around 06/01/2023) for HROB, Twins, US:EFW, MD, in person.   Orders Placed This Encounter  Procedures   Urine Culture   GC/Chlamydia Probe Amp   US OB Follow Up   US OB Follow Up AddL Gest   CBC/D/Plt+RPR+Rh+ABO+RubIgG...   HORIZON CUSTOM   HgB A1c   AFP, Serum, Open Spina Bifida    Cheral Marker CNM, [facility] 05/11/2023 12:15 PM

## 2023-05-11 NOTE — Progress Notes (Signed)
[place] 21+5 wks,DC/DA TWINS,CX 3.9 cm,normal ovaries BABY A female: cephalic left,posterior placenta gr 0,SVP of fluid 7.5 cm,FHR 146 bpm,EFW 453 g 48% BABY B female: cephalic right,posterior placenta gr 0,LVEICF,limited view of spine because of fetal position,need additional images of C/T spine,SVP of fluid 5.3 cm,FHR 159 bpm,EFW 601 g 99%,discordance 25%

## 2023-05-11 NOTE — Patient Instructions (Addendum)
Jolyn Nap, thank you for choosing our office today! We appreciate the opportunity to meet your healthcare needs. You may receive a short survey by mail, e-mail, or through [employer]. If you are happy with your care we would appreciate if you could take just a few minutes to complete the survey questions. We read all of your comments and take your feedback very seriously. Thank you again for choosing our office.  Center for [employer] Team at [facility] at [facility] ([address] [place], [place] [address]) Entrance C, located off of [address] Free 24/7 valet parking  Go to [email or web address] to register for FREE online childbirth classes  Call the office [phone]) or go to [facility] if: You begin to severe cramping Your water breaks.  Sometimes it is a big gush of fluid, sometimes it is just a trickle that keeps getting your panties wet or running down your legs You have vaginal bleeding.  It is normal to have a small amount of spotting if your cervix was checked.   [facility] Pediatricians/Family Doctors [facility]): [address] Colette Ribas, [phone]           [facility]: [address], [phone]                [facility]): [address], [phone] (call to ask if accepting patients) [facility] Department: [address], [place], [phone]    [facility] Pediatricians/Family Doctors [facility]): [phone] [address], [phone] [facility]: [address] [place], [phone] [facility]: [address], [phone]  [facility] Doctors  [facility]): [phone] [facility]: [address] [phone]   [facility] Doctors [facility]: [address] [phone]  [facility] Doctors  [facility]: [phone], [phone]  Home Blood Pressure Monitoring for Patients   Your provider has recommended that you check your blood pressure (BP) at least once a week at home. If you do not have a blood pressure cuff at home, one will be provided for you. Contact your provider if you have not received your monitor within 1 week.   Helpful Tips for Accurate Home Blood Pressure Checks  Don't smoke, exercise, or drink caffeine 30 minutes before checking your BP Use the restroom before checking your BP (a full bladder can raise your pressure) Relax in a comfortable upright chair Feet on the ground Left arm resting comfortably on a flat surface at the level of your heart Legs uncrossed Back supported Sit quietly and don't talk Place the cuff on your bare arm Adjust snuggly, so that only two fingertips can fit between your skin and the top of the cuff Check 2 readings separated by at least one minute Keep a log of your BP readings For a visual, please reference this diagram: http://ccnc.care/bpdiagram  Provider Name: [facility]     Phone: [phone]  Zone 1: ALL CLEAR  Continue to monitor your symptoms:  BP reading is less than 140 (top number) or less than 90 (bottom number)  No right upper stomach pain No headaches or seeing spots No feeling nauseated or throwing up No swelling in face and hands  Zone 2: CAUTION Call your doctor's office for any of the following:  BP reading is greater than 140 (top number) or greater than  90 (bottom number)  Stomach pain under your ribs in the middle or right side Headaches or seeing spots Feeling nauseated or throwing up Swelling in face and hands  Zone 3: EMERGENCY  Seek immediate medical care if you have any of the following:  BP reading is greater than160 (top number) or greater than 110 (bottom number) Severe headaches not improving with Tylenol Serious difficulty catching your breath Any worsening symptoms from  Zone 2     Second Trimester of Pregnancy The second trimester is from week 14 through week 27 (months 4 through 6). The second trimester is often a time when you feel your best. Your body has adjusted to being pregnant, and you begin to feel better physically. Usually, morning sickness has lessened or quit completely, you may have more energy, and you may have an increase in appetite. The second trimester is also a time when the fetus is growing rapidly. At the end of the sixth month, the fetus is about 9 inches long and weighs about 1 pounds. You will likely begin to feel the baby move (quickening) between 16 and 20 weeks of pregnancy. Body changes during your second trimester Your body continues to go through many changes during your second trimester. The changes vary from woman to woman. Your weight will continue to increase. You will notice your lower abdomen bulging out. You may begin to get stretch marks on your hips, abdomen, and breasts. You may develop headaches that can be relieved by medicines. The medicines should be approved by your health care provider. You may urinate more often because the fetus is pressing on your bladder. You may develop or continue to have heartburn as a result of your pregnancy. You may develop constipation because certain hormones are causing the muscles that push waste through your intestines to slow down. You may develop hemorrhoids or swollen, bulging veins (varicose veins). You may have back pain. This is caused by: Weight gain. Pregnancy hormones that are relaxing the joints in your pelvis. A shift in weight and the muscles that support your balance. Your breasts will continue to grow and they will continue to become tender. Your gums may bleed and may be sensitive to brushing and flossing. Dark spots or blotches (chloasma, mask of pregnancy) may develop on your face. This will likely fade after the baby is born. A dark line from your belly button to  the pubic area (linea nigra) may appear. This will likely fade after the baby is born. You may have changes in your hair. These can include thickening of your hair, rapid growth, and changes in texture. Some women also have hair loss during or after pregnancy, or hair that feels dry or thin. Your hair will most likely return to normal after your baby is born.  What to expect at prenatal visits During a routine prenatal visit: You will be weighed to make sure you and the fetus are growing normally. Your blood pressure will be taken. Your abdomen will be measured to track your baby's growth. The fetal heartbeat will be listened to. Any test results from the previous visit will be discussed.  Your health care provider may ask you: How you are feeling. If you are feeling the baby move. If you have had any abnormal symptoms, such as leaking fluid, bleeding, severe headaches, or abdominal cramping. If you are using any tobacco products, including cigarettes, chewing tobacco, and electronic cigarettes. If you have any questions.  Other tests that may be performed during  your second trimester include: Blood tests that check for: Low iron levels (anemia). High blood sugar that affects pregnant women (gestational diabetes) between 65 and 28 weeks. Rh antibodies. This is to check for a protein on red blood cells (Rh factor). Urine tests to check for infections, diabetes, or protein in the urine. An ultrasound to confirm the proper growth and development of the baby. An amniocentesis to check for possible genetic problems. Fetal screens for spina bifida and Down syndrome. HIV (human immunodeficiency virus) testing. Routine prenatal testing includes screening for HIV, unless you choose not to have this test.  Follow these instructions at home: Medicines Follow your health care provider's instructions regarding medicine use. Specific medicines may be either safe or unsafe to take during  pregnancy. Take a prenatal vitamin that contains at least 600 micrograms (mcg) of folic acid. If you develop constipation, try taking a stool softener if your health care provider approves. Eating and drinking Eat a balanced diet that includes fresh fruits and vegetables, whole grains, good sources of protein such as meat, eggs, or tofu, and low-fat dairy. Your health care provider will help you determine the amount of weight gain that is right for you. Avoid raw meat and uncooked cheese. These carry germs that can cause birth defects in the baby. If you have low calcium intake from food, talk to your health care provider about whether you should take a daily calcium supplement. Limit foods that are high in fat and processed sugars, such as fried and sweet foods. To prevent constipation: Drink enough fluid to keep your urine clear or pale yellow. Eat foods that are high in fiber, such as fresh fruits and vegetables, whole grains, and beans. Activity Exercise only as directed by your health care provider. Most women can continue their usual exercise routine during pregnancy. Try to exercise for 30 minutes at least 5 days a week. Stop exercising if you experience uterine contractions. Avoid heavy lifting, wear low heel shoes, and practice good posture. A sexual relationship may be continued unless your health care provider directs you otherwise. Relieving pain and discomfort Wear a good support bra to prevent discomfort from breast tenderness. Take warm sitz baths to soothe any pain or discomfort caused by hemorrhoids. Use hemorrhoid cream if your health care provider approves. Rest with your legs elevated if you have leg cramps or low back pain. If you develop varicose veins, wear support hose. Elevate your feet for 15 minutes, 3-4 times a day. Limit salt in your diet. Prenatal Care Write down your questions. Take them to your prenatal visits. Keep all your prenatal visits as told by your health  care provider. This is important. Safety Wear your seat belt at all times when driving. Make a list of emergency phone numbers, including numbers for family, friends, the hospital, and police and fire departments. General instructions Ask your health care provider for a referral to a local prenatal education class. Begin classes no later than the beginning of month 6 of your pregnancy. Ask for help if you have counseling or nutritional needs during pregnancy. Your health care provider can offer advice or refer you to specialists for help with various needs. Do not use hot tubs, steam rooms, or saunas. Do not douche or use tampons or scented sanitary pads. Do not cross your legs for long periods of time. Avoid cat litter boxes and soil used by cats. These carry germs that can cause birth defects in the baby and possibly loss of the  fetus by miscarriage or stillbirth. Avoid all smoking, herbs, alcohol, and unprescribed drugs. Chemicals in these products can affect the formation and growth of the baby. Do not use any products that contain nicotine or tobacco, such as cigarettes and e-cigarettes. If you need help quitting, ask your health care provider. Visit your dentist if you have not gone yet during your pregnancy. Use a soft toothbrush to brush your teeth and be gentle when you floss. Contact a health care provider if: You have dizziness. You have mild pelvic cramps, pelvic pressure, or nagging pain in the abdominal area. You have persistent nausea, vomiting, or diarrhea. You have a bad smelling vaginal discharge. You have pain when you urinate. Get help right away if: You have a fever. You are leaking fluid from your vagina. You have spotting or bleeding from your vagina. You have severe abdominal cramping or pain. You have rapid weight gain or weight loss. You have shortness of breath with chest pain. You notice sudden or extreme swelling of your face, hands, ankles, feet, or legs. You  have not felt your baby move in over an hour. You have severe headaches that do not go away when you take medicine. You have vision changes. Summary The second trimester is from week 14 through week 27 (months 4 through 6). It is also a time when the fetus is growing rapidly. Your body goes through many changes during pregnancy. The changes vary from woman to woman. Avoid all smoking, herbs, alcohol, and unprescribed drugs. These chemicals affect the formation and growth your baby. Do not use any tobacco products, such as cigarettes, chewing tobacco, and e-cigarettes. If you need help quitting, ask your health care provider. Contact your health care provider if you have any questions. Keep all prenatal visits as told by your health care provider. This is important. This information is not intended to replace advice given to you by your health care provider. Make sure you discuss any questions you have with your health care provider. Document Released: 03/18/2001 Document Revised: 08/30/2015 Document Reviewed: 05/25/2012 Elsevier Interactive Patient Education  2017 [employer].

## 2023-05-12 LAB — CBC/D/PLT+RPR+RH+ABO+RUBIGG...
Antibody Screen: NEGATIVE
Basophils Absolute: 0 10*3/uL (ref 0.0–0.2)
Basos: 0 %
EOS (ABSOLUTE): 0.1 10*3/uL (ref 0.0–0.4)
Eos: 1 %
HCV Ab: NONREACTIVE
HIV Screen 4th Generation wRfx: NONREACTIVE
Hematocrit: 33.1 % — ABNORMAL LOW (ref 34.0–46.6)
Hemoglobin: 11.1 g/dL (ref 11.1–15.9)
Hepatitis B Surface Ag: NEGATIVE
Immature Grans (Abs): 0.1 10*3/uL (ref 0.0–0.1)
Immature Granulocytes: 1 %
Lymphocytes Absolute: 2 10*3/uL (ref 0.7–3.1)
Lymphs: 18 %
MCH: 31.5 pg (ref 26.6–33.0)
MCHC: 33.5 g/dL (ref 31.5–35.7)
MCV: 94 fL (ref 79–97)
Monocytes Absolute: 0.6 10*3/uL (ref 0.1–0.9)
Monocytes: 5 %
Neutrophils Absolute: 8.2 10*3/uL — ABNORMAL HIGH (ref 1.4–7.0)
Neutrophils: 75 %
Platelets: 204 10*3/uL (ref 150–450)
RBC: 3.52 x10E6/uL — ABNORMAL LOW (ref 3.77–5.28)
RDW: 13.1 % (ref 11.7–15.4)
RPR Ser Ql: NONREACTIVE
Rh Factor: POSITIVE
Rubella Antibodies, IGG: 3.31 {index} (ref >0.99)
WBC: 10.9 10*3/uL — ABNORMAL HIGH (ref 3.4–10.8)

## 2023-05-12 LAB — HCV INTERPRETATION

## 2023-05-12 LAB — HEMOGLOBIN A1C
Est. average glucose Bld gHb Est-mCnc: 97 mg/dL
Hgb A1c MFr Bld: 5 % (ref 4.8–5.6)

## 2023-05-14 ENCOUNTER — Encounter: Payer: Self-pay | Admitting: Women's Health

## 2023-05-14 LAB — AFP, SERUM, OPEN SPINA BIFIDA
AFP MoM: 1.94
AFP Value: 106.9 ng/mL
Gest. Age on Collection Date: 21 wk
Maternal Age At EDD: 27.4 a
OSBR Risk 1 IN: 3792
Weight: 223 [lb_av]

## 2023-05-15 LAB — HORIZON CUSTOM

## 2023-05-18 ENCOUNTER — Encounter: Payer: Self-pay | Admitting: Women's Health

## 2023-05-20 ENCOUNTER — Encounter: Payer: Self-pay | Admitting: Women's Health

## 2023-06-04 ENCOUNTER — Encounter: Payer: Self-pay | Admitting: Obstetrics & Gynecology

## 2023-06-04 ENCOUNTER — Ambulatory Visit (INDEPENDENT_AMBULATORY_CARE_PROVIDER_SITE_OTHER): Payer: 59 | Admitting: Obstetrics & Gynecology

## 2023-06-04 ENCOUNTER — Ambulatory Visit (INDEPENDENT_AMBULATORY_CARE_PROVIDER_SITE_OTHER): Payer: 59

## 2023-06-04 VITALS — BP 127/85 | HR 99 | Wt 231.0 lb

## 2023-06-04 DIAGNOSIS — Z362 Encounter for other antenatal screening follow-up: Secondary | ICD-10-CM

## 2023-06-04 DIAGNOSIS — O30042 Twin pregnancy, dichorionic/diamniotic, second trimester: Secondary | ICD-10-CM

## 2023-06-04 DIAGNOSIS — G5601 Carpal tunnel syndrome, right upper limb: Secondary | ICD-10-CM

## 2023-06-04 DIAGNOSIS — O0992 Supervision of high risk pregnancy, unspecified, second trimester: Secondary | ICD-10-CM

## 2023-06-04 DIAGNOSIS — Z3A25 25 weeks gestation of pregnancy: Secondary | ICD-10-CM

## 2023-06-04 NOTE — Progress Notes (Signed)
 [place] 25+1 wks,DC/DA twins,CX 3.8 cm,normal ovaries BABY A: female,cephalic left,polyhydramnios,SVP of fluid 8.4 cm,FHR 153 bpm,posterior placenta gr 0,EFW 760 g 34% BABY B: female,breech right,polyhydramnios,SVP of fluid 10.4 cm,LVEICF,posterior placenta gr 0,EFW 994 g 96%,discordance 24%

## 2023-06-04 NOTE — Progress Notes (Signed)
 HIGH-RISK PREGNANCY VISIT Patient name: Shelly Mccann MRN [id number]  Date of birth: [birth year] Chief Complaint:   Routine Prenatal Visit  History of Present Illness:   Shelly Mccann is a 28 y.o. G31P0030 female at [redacted]w[redacted]d with an Estimated Date of Delivery: 09/16/23 being seen today for ongoing management of a high-risk pregnancy complicated by     ICD-10-CM   1. Supervision of high risk pregnancy in second trimester  O09.92     2. Dichorionic diamniotic twin pregnancy in second trimester  O30.042     3. Carpal tunnel syndrome of right wrist  G56.01      .    Today she reports no complaints. Contractions: Not present. Vag. Bleeding: None.  Movement: Present. denies leaking of fluid.      05/11/2023   11:09 AM 08/13/2020    4:08 PM  Depression screen PHQ 2/9  Decreased Interest 1 0  Down, Depressed, Hopeless 0 0  PHQ - 2 Score 1 0  Altered sleeping 2 0  Tired, decreased energy 1 0  Change in appetite 0 1  Feeling bad or failure about yourself  0 1  Trouble concentrating 0 0  Moving slowly or fidgety/restless 0 0  Suicidal thoughts 0 0  PHQ-9 Score 4 2        05/11/2023   11:09 AM 08/13/2020    4:08 PM  GAD 7 : Generalized Anxiety Score  Nervous, Anxious, on Edge 0 1  Control/stop worrying 0 0  Worry too much - different things 0 1  Trouble relaxing 0 0  Restless 0 0  Easily annoyed or irritable 2 1  Afraid - awful might happen 0 0  Total GAD 7 Score 2 3     Review of Systems:   Pertinent items are noted in HPI Denies abnormal vaginal discharge w/ itching/odor/irritation, headaches, visual changes, shortness of breath, chest pain, abdominal pain, severe nausea/vomiting, or problems with urination or bowel movements unless otherwise stated above. Pertinent History Reviewed:  Reviewed past medical,surgical, social, obstetrical and family history.  Reviewed problem list, medications and allergies. Physical Assessment:   Vitals:   06/04/23 1450  BP: 127/85   Pulse: 99  Weight: 231 lb (104.8 kg)  Body mass index is 34.11 kg/m.           Physical Examination:   General appearance: alert, well appearing, and in no distress  Mental status: alert, oriented to person, place, and time  Skin: warm & dry   Extremities: Edema: Mild pitting, slight indentation    Cardiovascular: normal heart rate noted  Respiratory: normal respiratory effort, no distress  Abdomen: gravid, soft, non-tender  Pelvic: Cervical exam deferred         Fetal Status:     Movement: Present    Fetal Surveillance Testing today: 34%/96%=24% discordance not because 1 baby is small, but because 1 is really big!   Chaperone: N/A    No results found for this or any previous visit (from the past 24 hours).  Assessment & Plan:  High-risk pregnancy: G4P0030 at [redacted]w[redacted]d with an Estimated Date of Delivery: 09/16/23      ICD-10-CM   1. Supervision of high risk pregnancy in second trimester  O09.92     2. Dichorionic diamniotic twin pregnancy in second trimester  O30.042     3. Carpal tunnel syndrome of right wrist  G56.01         Meds: No orders of the defined types were placed in  this encounter.   Orders: No orders of the defined types were placed in this encounter.    Labs/procedures today: U/S  Treatment Plan:  keep    Follow-up: Return in about 2 weeks (around 06/18/2023) for PN2 + OBV, 4 weeks sonogram + OBV.   Future Appointments  Date Time Provider Department Center  07/02/2023  1:30 PM [facility] - FT IMG 2 [facility] None  07/02/2023  3:10 PM Aristea Posada, Amaryllis Dyke, MD [facility]    No orders of the defined types were placed in this encounter.  Lazaro Arms  Attending Physician for the Center for [facility] 06/21/2023 11:12 PM

## 2023-06-10 ENCOUNTER — Encounter: Payer: Self-pay | Admitting: *Deleted

## 2023-06-11 NOTE — Addendum Note (Signed)
 Addended by: Moss Mc on: 06/11/2023 12:31 PM   Modules accepted: Orders

## 2023-06-18 ENCOUNTER — Encounter: Payer: Self-pay | Admitting: Obstetrics & Gynecology

## 2023-06-18 ENCOUNTER — Ambulatory Visit (INDEPENDENT_AMBULATORY_CARE_PROVIDER_SITE_OTHER): Payer: 59 | Admitting: Obstetrics & Gynecology

## 2023-06-18 ENCOUNTER — Other Ambulatory Visit: Payer: 59

## 2023-06-18 VITALS — BP 136/77 | HR 94 | Wt 231.0 lb

## 2023-06-18 DIAGNOSIS — O0992 Supervision of high risk pregnancy, unspecified, second trimester: Secondary | ICD-10-CM

## 2023-06-18 DIAGNOSIS — O30002 Twin pregnancy, unspecified number of placenta and unspecified number of amniotic sacs, second trimester: Secondary | ICD-10-CM

## 2023-06-18 DIAGNOSIS — O365920 Maternal care for other known or suspected poor fetal growth, second trimester, not applicable or unspecified: Secondary | ICD-10-CM

## 2023-06-18 DIAGNOSIS — O30042 Twin pregnancy, dichorionic/diamniotic, second trimester: Secondary | ICD-10-CM | POA: Diagnosis not present

## 2023-06-18 DIAGNOSIS — Z3A26 26 weeks gestation of pregnancy: Secondary | ICD-10-CM

## 2023-06-18 DIAGNOSIS — Z3A27 27 weeks gestation of pregnancy: Secondary | ICD-10-CM

## 2023-06-18 DIAGNOSIS — Z131 Encounter for screening for diabetes mellitus: Secondary | ICD-10-CM

## 2023-06-18 MED ORDER — TERCONAZOLE 0.4 % VA CREA: 1.0000 | TOPICAL_CREAM | Freq: Every day | VAGINAL | 0 refills | Status: DC

## 2023-06-18 NOTE — Progress Notes (Signed)
 HIGH-RISK PREGNANCY VISIT Patient name: Shelly Mccann MRN [id number]  Date of birth: [birth year] Chief Complaint:   Routine Prenatal Visit  History of Present Illness:   Shelly Mccann is a 28 y.o. G62P0030 female at [redacted]w[redacted]d with an Estimated Date of Delivery: 09/16/23 being seen today for ongoing management of a high-risk pregnancy complicated by DCDA twins with 24% discordance.    Today she reports no complaints. Contractions: Not present.  .  Movement: Present. denies leaking of fluid.      05/11/2023   11:09 AM 08/13/2020    4:08 PM  Depression screen PHQ 2/9  Decreased Interest 1 0  Down, Depressed, Hopeless 0 0  PHQ - 2 Score 1 0  Altered sleeping 2 0  Tired, decreased energy 1 0  Change in appetite 0 1  Feeling bad or failure about yourself  0 1  Trouble concentrating 0 0  Moving slowly or fidgety/restless 0 0  Suicidal thoughts 0 0  PHQ-9 Score 4 2        05/11/2023   11:09 AM 08/13/2020    4:08 PM  GAD 7 : Generalized Anxiety Score  Nervous, Anxious, on Edge 0 1  Control/stop worrying 0 0  Worry too much - different things 0 1  Trouble relaxing 0 0  Restless 0 0  Easily annoyed or irritable 2 1  Afraid - awful might happen 0 0  Total GAD 7 Score 2 3     Review of Systems:   Pertinent items are noted in HPI Denies abnormal vaginal discharge w/ itching/odor/irritation, headaches, visual changes, shortness of breath, chest pain, abdominal pain, severe nausea/vomiting, or problems with urination or bowel movements unless otherwise stated above. Pertinent History Reviewed:  Reviewed past medical,surgical, social, obstetrical and family history.  Reviewed problem list, medications and allergies. Physical Assessment:   Vitals:   06/18/23 0857  BP: 136/77  Pulse: 94  Weight: 231 lb (104.8 kg)  Body mass index is 34.11 kg/m.           Physical Examination:   General appearance: alert, well appearing, and in no distress  Mental status: alert, oriented to person,  place, and time  Skin: warm & dry   Extremities:      Cardiovascular: normal heart rate noted  Respiratory: normal respiratory effort, no distress  Abdomen: gravid, soft, non-tender  Pelvic: Cervical exam deferred        +yeast vaginally and on the vulva Fetal Status:     Movement: Present    Fetal Surveillance Testing today: FHR 144/154   Chaperone: N/A    No results found for this or any previous visit (from the past 24 hours).  Assessment & Plan:  High-risk pregnancy: G4P0030 at [redacted]w[redacted]d with an Estimated Date of Delivery: 09/16/23      ICD-10-CM   1. Supervision of high risk pregnancy in second trimester  O09.92     2. Dichorionic diamniotic twin pregnancy in second trimester  O30.042     3. Inter-twin discordance, second trimester: 24%  O30.002    O36.5920         Meds: No orders of the defined types were placed in this encounter.   Orders: No orders of the defined types were placed in this encounter.    Labs/procedures today: PN2  Treatment Plan:  sonogram 2 weeks    Follow-up: No follow-ups on file.   Future Appointments  Date Time Provider Department Center  07/02/2023  1:30 PM [facility] -  FT IMG 2 [facility] None  07/02/2023  3:10 PM Olof Marcil, Amaryllis Dyke, MD [facility]    No orders of the defined types were placed in this encounter.  Lazaro Arms  Attending Physician for the Center for [facility] 06/18/2023 9:36 AM

## 2023-06-19 LAB — GLUCOSE TOLERANCE, 2 HOURS W/ 1HR
Glucose, 1 hour: 207 mg/dL — ABNORMAL HIGH (ref 70–179)
Glucose, 2 hour: 150 mg/dL (ref 70–152)
Glucose, Fasting: 85 mg/dL (ref 70–91)

## 2023-06-30 ENCOUNTER — Encounter: Payer: Self-pay | Admitting: Obstetrics & Gynecology

## 2023-06-30 ENCOUNTER — Other Ambulatory Visit: Payer: Self-pay | Admitting: Obstetrics & Gynecology

## 2023-06-30 DIAGNOSIS — O30049 Twin pregnancy, dichorionic/diamniotic, unspecified trimester: Secondary | ICD-10-CM

## 2023-06-30 DIAGNOSIS — O0990 Supervision of high risk pregnancy, unspecified, unspecified trimester: Secondary | ICD-10-CM

## 2023-07-02 ENCOUNTER — Encounter: Payer: Self-pay | Admitting: Obstetrics & Gynecology

## 2023-07-02 ENCOUNTER — Ambulatory Visit: Payer: Self-pay | Admitting: Radiology

## 2023-07-02 ENCOUNTER — Other Ambulatory Visit: Payer: Self-pay | Admitting: Obstetrics & Gynecology

## 2023-07-02 ENCOUNTER — Ambulatory Visit (INDEPENDENT_AMBULATORY_CARE_PROVIDER_SITE_OTHER): Payer: 59 | Admitting: Obstetrics & Gynecology

## 2023-07-02 VITALS — BP 115/75 | HR 83 | Wt 231.0 lb

## 2023-07-02 DIAGNOSIS — O0993 Supervision of high risk pregnancy, unspecified, third trimester: Secondary | ICD-10-CM

## 2023-07-02 DIAGNOSIS — O30003 Twin pregnancy, unspecified number of placenta and unspecified number of amniotic sacs, third trimester: Secondary | ICD-10-CM

## 2023-07-02 DIAGNOSIS — O30049 Twin pregnancy, dichorionic/diamniotic, unspecified trimester: Secondary | ICD-10-CM

## 2023-07-02 DIAGNOSIS — O30043 Twin pregnancy, dichorionic/diamniotic, third trimester: Secondary | ICD-10-CM

## 2023-07-02 DIAGNOSIS — O0990 Supervision of high risk pregnancy, unspecified, unspecified trimester: Secondary | ICD-10-CM

## 2023-07-02 DIAGNOSIS — O365930 Maternal care for other known or suspected poor fetal growth, third trimester, not applicable or unspecified: Secondary | ICD-10-CM

## 2023-07-02 DIAGNOSIS — Z3A29 29 weeks gestation of pregnancy: Secondary | ICD-10-CM

## 2023-07-02 DIAGNOSIS — O0992 Supervision of high risk pregnancy, unspecified, second trimester: Secondary | ICD-10-CM

## 2023-07-02 DIAGNOSIS — O30002 Twin pregnancy, unspecified number of placenta and unspecified number of amniotic sacs, second trimester: Secondary | ICD-10-CM

## 2023-07-02 NOTE — Progress Notes (Signed)
 HIGH-RISK PREGNANCY VISIT Patient name: Shelly Mccann MRN [id number]  Date of birth: May 22, 1995 Chief Complaint:   Routine Prenatal Visit  History of Present Illness:   Shelly Mccann is a 28 y.o. G71P0030 female at [redacted]w[redacted]d with an Estimated Date of Delivery: 09/16/23 being seen today for ongoing management of a high-risk pregnancy complicated by DCDA twins with 20% discordance, B is just a big baby with ^fluid volume,.    Today she reports no complaints. Contractions: Not present. Vag. Bleeding: None.  Movement: Present. denies leaking of fluid.      05/11/2023   11:09 AM 08/13/2020    4:08 PM  Depression screen PHQ 2/9  Decreased Interest 1 0  Down, Depressed, Hopeless 0 0  PHQ - 2 Score 1 0  Altered sleeping 2 0  Tired, decreased energy 1 0  Change in appetite 0 1  Feeling bad or failure about yourself  0 1  Trouble concentrating 0 0  Moving slowly or fidgety/restless 0 0  Suicidal thoughts 0 0  PHQ-9 Score 4 2        05/11/2023   11:09 AM 08/13/2020    4:08 PM  GAD 7 : Generalized Anxiety Score  Nervous, Anxious, on Edge 0 1  Control/stop worrying 0 0  Worry too much - different things 0 1  Trouble relaxing 0 0  Restless 0 0  Easily annoyed or irritable 2 1  Afraid - awful might happen 0 0  Total GAD 7 Score 2 3     Review of Systems:   Pertinent items are noted in HPI Denies abnormal vaginal discharge w/ itching/odor/irritation, headaches, visual changes, shortness of breath, chest pain, abdominal pain, severe nausea/vomiting, or problems with urination or bowel movements unless otherwise stated above. Pertinent History Reviewed:  Reviewed past medical,surgical, social, obstetrical and family history.  Reviewed problem list, medications and allergies. Physical Assessment:   Vitals:   07/02/23 1443  BP: 115/75  Pulse: 83  Weight: 231 lb (104.8 kg)  Body mass index is 34.11 kg/m.           Physical Examination:   General appearance: alert, well appearing, and  in no distress  Mental status: alert, oriented to person, place, and time  Skin: warm & dry   Extremities:      Cardiovascular: normal heart rate noted  Respiratory: normal respiratory effort, no distress  Abdomen: gravid, soft, non-tender  Pelvic: Cervical exam deferred         Fetal Status:     Movement: Present    Fetal Surveillance Testing today: see sonogram report   Chaperone:     No results found for this or any previous visit (from the past 24 hours).  Assessment & Plan:  High-risk pregnancy: G4P0030 at [redacted]w[redacted]d with an Estimated Date of Delivery: 09/16/23      ICD-10-CM   1. Supervision of high risk pregnancy in second trimester  O09.92     2. Dichorionic diamniotic twin pregnancy in third trimester  O30.043     3. Inter-twin discordance, third trimester: 20%  O30.002    O36.5920          Meds: No orders of the defined types were placed in this encounter.   Orders: No orders of the defined types were placed in this encounter.    Labs/procedures today: U/S  Treatment Plan:  begin NSTs @ 32 weeks, growth q4w, A @18 %    Follow-up: Return in about 3 weeks (around 07/23/2023) for  NST, 4 weeks sonogram.   No future appointments.   No orders of the defined types were placed in this encounter.  Lazaro Arms  Attending Physician for the Center for [facility] 07/02/2023 3:11 PM

## 2023-07-02 NOTE — Progress Notes (Signed)
 [place]:  GA = 29+1 weeks  Dichorionic-Diamniotic Twins  Twin A is female in cephalic position on maternal left with Left Lateral pl, gr1, FHR = 141 bpm MVP amniotic fluid = 5 cm, EFW 18%, AC @ 8.4%,  1234g,  Overall less amn fluid compared to Twin B.  Twin A's sac smaller due to Polyhydramnios in Twin B's sac.  Twin A has subjectively low amniotic fluid volume. Inner twin membrane seen,  Good movements seen on Twin A.   Cord dopplers done due to [facility] @ 8%  -  RI: 0.79, 0.74, 0.73  93%   Twin B is female in breech position on maternal Right with fundal posterior pl, gr1, FHR = 164 bpm MVP = 9.7 cm, polyhydramnios and slight thinning of placenta is seen.   Good movements on Twin B.  EFW 78%, AC @ 80%, 1551g,  RI: 0.61,  27%   20.4% discordance in weight

## 2023-07-08 ENCOUNTER — Encounter: Payer: Self-pay | Admitting: Obstetrics & Gynecology

## 2023-07-10 ENCOUNTER — Encounter: Payer: Self-pay | Admitting: *Deleted

## 2023-07-10 ENCOUNTER — Other Ambulatory Visit: Payer: Self-pay | Admitting: Obstetrics & Gynecology

## 2023-07-10 DIAGNOSIS — O24410 Gestational diabetes mellitus in pregnancy, diet controlled: Secondary | ICD-10-CM

## 2023-07-10 MED ORDER — GLUCOSE BLOOD VI STRP: ORAL_STRIP | 12 refills | Status: DC

## 2023-07-10 MED ORDER — ACCU-CHEK SOFTCLIX LANCETS MISC: 12 refills | Status: DC

## 2023-07-10 MED ORDER — ACCU-CHEK GUIDE ME W/DEVICE KIT: 1.0000 | PACK | Freq: Four times a day (QID) | 0 refills | Status: DC

## 2023-07-15 ENCOUNTER — Encounter: Payer: Self-pay | Admitting: *Deleted

## 2023-07-15 ENCOUNTER — Telehealth: Payer: Self-pay | Admitting: Family Medicine

## 2023-07-15 NOTE — Telephone Encounter (Signed)
 Attempted to reach the patient to schedule an appointment based on the referral. Left a message asking her to call the office if she has any questions and provided the appointment details.

## 2023-07-15 NOTE — Telephone Encounter (Signed)
-----   Message from Nurse Debbe Odea C sent at 07/10/2023 10:59 AM EDT ----- Regarding: GDM Referral This patient needs a referral for newly diagnosed gestational diabetes.  Thanks. Shelly Mccann

## 2023-07-21 ENCOUNTER — Telehealth: Payer: Self-pay | Admitting: Dietician

## 2023-07-21 ENCOUNTER — Other Ambulatory Visit: Payer: Self-pay | Admitting: Obstetrics & Gynecology

## 2023-07-21 ENCOUNTER — Other Ambulatory Visit: Payer: Self-pay

## 2023-07-21 DIAGNOSIS — O30049 Twin pregnancy, dichorionic/diamniotic, unspecified trimester: Secondary | ICD-10-CM

## 2023-07-21 DIAGNOSIS — O30003 Twin pregnancy, unspecified number of placenta and unspecified number of amniotic sacs, third trimester: Secondary | ICD-10-CM

## 2023-07-21 DIAGNOSIS — O24410 Gestational diabetes mellitus in pregnancy, diet controlled: Secondary | ICD-10-CM

## 2023-07-21 DIAGNOSIS — O0990 Supervision of high risk pregnancy, unspecified, unspecified trimester: Secondary | ICD-10-CM

## 2023-07-21 NOTE — Telephone Encounter (Signed)
 Called patient due to missed appointment today for newly diagnosed GDM. Patient states that she did not come due to not knowing what the appointment was for.  She states that she speaks with a nutritionist at [facility].  She states that she is very needle phobic, even at the MD office and would not be able to self monitor.  Discussed CGM options and that the [facility] my be the best out of pocket choice.  It can be ordered and see if it goes through her insurance but may not due to no insulin use at this time. Discussed basics of GDM diet.  Will email her the following handouts: Blood Glucose Log Nutrition, Diabetes, and Pregnancy Snacks during pregnancy  Will also email her details about the CGM and my office numbers.  Cydne Doyne, RD, LDN, CDCES, DipACLM

## 2023-07-23 ENCOUNTER — Ambulatory Visit: Admitting: Obstetrics & Gynecology

## 2023-07-23 ENCOUNTER — Encounter: Payer: Self-pay | Admitting: Obstetrics & Gynecology

## 2023-07-23 ENCOUNTER — Other Ambulatory Visit: Payer: Self-pay

## 2023-07-23 VITALS — BP 135/87 | HR 56 | Wt 247.2 lb

## 2023-07-23 DIAGNOSIS — O0993 Supervision of high risk pregnancy, unspecified, third trimester: Secondary | ICD-10-CM

## 2023-07-23 DIAGNOSIS — Z3A32 32 weeks gestation of pregnancy: Secondary | ICD-10-CM | POA: Diagnosis not present

## 2023-07-23 DIAGNOSIS — O30042 Twin pregnancy, dichorionic/diamniotic, second trimester: Secondary | ICD-10-CM | POA: Diagnosis not present

## 2023-07-23 DIAGNOSIS — O24410 Gestational diabetes mellitus in pregnancy, diet controlled: Secondary | ICD-10-CM

## 2023-07-23 MED ORDER — FREESTYLE LIBRE 3 SENSOR MISC: 6 refills | Status: DC

## 2023-07-23 MED ORDER — FREESTYLE LIBRE 2 SENSOR MISC: 6 refills | Status: DC

## 2023-07-23 MED ORDER — FREESTYLE LIBRE 2 READER DEVI: 0 refills | Status: DC

## 2023-07-23 MED ORDER — FREESTYLE LIBRE 3 READER DEVI: 0 refills | Status: DC

## 2023-07-23 NOTE — Progress Notes (Signed)
 HIGH-RISK PREGNANCY VISIT Patient name: Shelly Mccann MRN [id number]  Date of birth: Mar 01, 1996 Chief Complaint:   Routine Prenatal Visit  History of Present Illness:   Shelly Mccann is a 28 y.o. G9P0030 female at [redacted]w[redacted]d with an Estimated Date of Delivery: 09/16/23 being seen today for ongoing management of a high-risk pregnancy complicated by:  -Di/Di twins  -GDMA1  Today she reports  She has not started checking her sugars.  Pt notes fear of needles and states "she cannot do it."  Requesting Freestyle Josephine Igo- pt thinks this may be covered by her insurance .   Contractions: Not present. Vag. Bleeding: None.  Movement: Present. denies leaking of fluid.      05/11/2023   11:09 AM 08/13/2020    4:08 PM  Depression screen PHQ 2/9  Decreased Interest 1 0  Down, Depressed, Hopeless 0 0  PHQ - 2 Score 1 0  Altered sleeping 2 0  Tired, decreased energy 1 0  Change in appetite 0 1  Feeling bad or failure about yourself  0 1  Trouble concentrating 0 0  Moving slowly or fidgety/restless 0 0  Suicidal thoughts 0 0  PHQ-9 Score 4 2     Current Outpatient Medications  Medication Instructions   Accu-Chek Softclix Lancets lancets Use as instructed to check blood sugar 4 times daily   aspirin EC 81 mg, Oral, Daily, Swallow whole.   Blood Glucose Monitoring Suppl (ACCU-CHEK GUIDE ME) w/Device KIT 1 each, Does not apply, 4 times daily   Continuous Glucose Receiver (FREESTYLE LIBRE 3 READER) DEVI Use as directed to check glucose daily   Continuous Glucose Sensor (FREESTYLE LIBRE 3 SENSOR) MISC Use as directed to monitor glucose daily   glucose blood test strip Use as instructed to check blood sugar four times daily   Prenatal Vit-Fe Fumarate-FA (MULTIVITAMIN-PRENATAL) 27-0.8 MG TABS tablet 1 tablet, Daily   promethazine (PHENERGAN) 12.5-25 mg, Oral, Every 6 hours PRN   terconazole (TERAZOL 7) 0.4 % vaginal cream 1 applicator, Vaginal, Daily at bedtime     Review of Systems:   Pertinent  items are noted in HPI Denies abnormal vaginal discharge w/ itching/odor/irritation, headaches, visual changes, shortness of breath, chest pain, abdominal pain, severe nausea/vomiting, or problems with urination or bowel movements unless otherwise stated above. Pertinent History Reviewed:  Reviewed past medical,surgical, social, obstetrical and family history.  Reviewed problem list, medications and allergies. Physical Assessment:   Vitals:   07/23/23 0913  BP: 135/87  Pulse: (!) 56  Weight: 247 lb 3.2 oz (112.1 kg)  Body mass index is 36.51 kg/m.           Physical Examination:   General appearance: alert, well appearing, and in no distress  Mental status: normal mood, behavior, speech, dress, motor activity, and thought processes  Skin: warm & dry   Extremities:   no edema   Cardiovascular: normal heart rate noted  Respiratory: normal respiratory effort, no distress  Abdomen: gravid, soft, non-tender  Pelvic: Cervical exam deferred         Fetal Status:     Movement: Present    Fetal Surveillance Testing today: NST  Twin A: 120bpm, moderate variability, +accels 15x15, no decels  Twin B: 140bpm, moderate variability, +accels 15 x 15, no decels  Reactive x 2  Chaperone: N/A    No results found for this or any previous visit (from the past 24 hours).   Assessment & Plan:  High-risk pregnancy: G4P0030 at [redacted]w[redacted]d with an  Estimated Date of Delivery: 09/16/23   1) GDMA1 -plan to order Dexcom/Libre -continue growth q 4 wk  2) Di/Di twins with discordant growth -reactive NST  -continue antepartum testing  3) Prior UTI- repeat testing today  Meds:  Meds ordered this encounter  Medications   DISCONTD: Continuous Glucose Receiver (FREESTYLE LIBRE 2 READER) DEVI    Sig: Use as directed to monitor glucose daily    Dispense:  1 each    Refill:  0   DISCONTD: Continuous Glucose Sensor (FREESTYLE LIBRE 2 SENSOR) MISC    Sig: Use as directed to monitor glucose daily     Dispense:  2 each    Refill:  6   Continuous Glucose Sensor (FREESTYLE LIBRE 3 SENSOR) MISC    Sig: Use as directed to monitor glucose daily    Dispense:  2 each    Refill:  6   Continuous Glucose Receiver (FREESTYLE LIBRE 3 READER) DEVI    Sig: Use as directed to check glucose daily    Dispense:  1 each    Refill:  0    Labs/procedures today: NST  Treatment Plan:  continue care as outlined above and routine OB care  Reviewed: Preterm labor symptoms and general obstetric precautions including but not limited to vaginal bleeding, contractions, leaking of fluid and fetal movement were reviewed in detail with the patient.  All questions were answered. BP cuff at work Check bp weekly, let us  know if >140/90.   Follow-up: Return for twice weekly testing with HROB visit as scheduled.   Future Appointments  Date Time Provider Department Center  07/28/2023  2:15 PM [facility] - FT IMG 2 [facility] None  07/28/2023  3:50 PM Ferd Householder, CNM [facility] FTOBGYN  07/31/2023  8:50 AM [facility]FT[facility]GY[facility] NURSE [facility]  08/04/2023  3:30 PM [facility] NURSE [facility]  08/07/2023  9:10 AM Wendelyn Halter, MD [facility]TO[facility]  08/07/2023  9:10 AM [facility] NURSE [facility]  08/10/2023  3:30 PM [facility] NURSE [facility]  08/13/2023  2:50 PM [facility]FT[facility] NURSE [facility]  08/13/2023  3:10 PM Wendelyn Halter, MD [facility]  08/17/2023  3:30 PM [facility] NURSE [facility]  08/20/2023  2:30 PM [facility] NURSE [facility]  08/20/2023  2:50 PM Keene Pastures, DO [facility]TO[facility]  08/25/2023  2:15 PM [facility] - FT IMG 2 [facility]FT[facility]G None  08/25/2023  3:50 PM Wendelyn Halter, MD [facility] FTO[facility]  08/28/2023 10:10 AM [facility] NURSE [facility]  09/01/2023  3:30 PM [facility] NURSE [facility]  09/01/2023  3:50 PM Wendelyn Halter, MD [facility]  09/04/2023  8:50 AM [facility] NURSE [facility]  09/07/2023  3:30 PM [facility] NURSE [facility]  09/07/2023  3:50 PM Ferd Householder, CNM [facility]  FTOBGYN  09/10/2023  2:30 PM [facility] NURSE [facility]  09/14/2023  3:30 PM [facility] NURSE [facility]  09/14/2023  3:50 PM Eure, Sixto Duhamel, MD [facility] FTOBGYN    Orders Placed This Encounter  Procedures   GC/Chlamydia Probe Amp   Urine Culture    Keene Pastures, DO Attending Obstetrician & Gynecologist, Faculty Practice Center for [facility], [facility]

## 2023-07-25 LAB — GC/CHLAMYDIA PROBE AMP
Chlamydia trachomatis, NAA: NEGATIVE
Neisseria Gonorrhoeae by PCR: NEGATIVE

## 2023-07-27 ENCOUNTER — Other Ambulatory Visit

## 2023-07-27 ENCOUNTER — Encounter: Payer: Self-pay | Admitting: Obstetrics & Gynecology

## 2023-07-27 LAB — URINE CULTURE

## 2023-07-28 ENCOUNTER — Inpatient Hospital Stay (HOSPITAL_COMMUNITY): Admitting: Anesthesiology

## 2023-07-28 ENCOUNTER — Other Ambulatory Visit: Payer: Self-pay

## 2023-07-28 ENCOUNTER — Encounter (HOSPITAL_COMMUNITY): Payer: Self-pay | Admitting: Emergency Medicine

## 2023-07-28 ENCOUNTER — Inpatient Hospital Stay (HOSPITAL_COMMUNITY)
Admission: EM | Admit: 2023-07-28 | Discharge: 2023-07-31 | DRG: 786 | Disposition: A | Discharge status: Home / Self Care | Attending: Obstetrics and Gynecology | Admitting: Obstetrics and Gynecology

## 2023-07-28 ENCOUNTER — Encounter: Admitting: Advanced Practice Midwife

## 2023-07-28 ENCOUNTER — Encounter (HOSPITAL_COMMUNITY)
Admission: EM | Disposition: A | Discharge status: Home / Self Care | Payer: Self-pay | Source: Home / Self Care | Attending: Family Medicine

## 2023-07-28 ENCOUNTER — Other Ambulatory Visit: Admitting: Radiology

## 2023-07-28 DIAGNOSIS — O403XX2 Polyhydramnios, third trimester, fetus 2: Secondary | ICD-10-CM | POA: Diagnosis present

## 2023-07-28 DIAGNOSIS — Z23 Encounter for immunization: Secondary | ICD-10-CM

## 2023-07-28 DIAGNOSIS — Z3A32 32 weeks gestation of pregnancy: Secondary | ICD-10-CM

## 2023-07-28 DIAGNOSIS — O365930 Maternal care for other known or suspected poor fetal growth, third trimester, not applicable or unspecified: Secondary | ICD-10-CM | POA: Diagnosis not present

## 2023-07-28 DIAGNOSIS — O322XX2 Maternal care for transverse and oblique lie, fetus 2: Secondary | ICD-10-CM | POA: Diagnosis not present

## 2023-07-28 DIAGNOSIS — O42913 Preterm premature rupture of membranes, unspecified as to length of time between rupture and onset of labor, third trimester: Secondary | ICD-10-CM | POA: Diagnosis present

## 2023-07-28 DIAGNOSIS — O30043 Twin pregnancy, dichorionic/diamniotic, third trimester: Secondary | ICD-10-CM

## 2023-07-28 DIAGNOSIS — Z8249 Family history of ischemic heart disease and other diseases of the circulatory system: Secondary | ICD-10-CM

## 2023-07-28 DIAGNOSIS — O99324 Drug use complicating childbirth: Secondary | ICD-10-CM | POA: Diagnosis present

## 2023-07-28 DIAGNOSIS — O479 False labor, unspecified: Secondary | ICD-10-CM | POA: Diagnosis not present

## 2023-07-28 DIAGNOSIS — R0902 Hypoxemia: Secondary | ICD-10-CM | POA: Diagnosis not present

## 2023-07-28 DIAGNOSIS — O24420 Gestational diabetes mellitus in childbirth, diet controlled: Secondary | ICD-10-CM | POA: Diagnosis present

## 2023-07-28 DIAGNOSIS — O4290 Premature rupture of membranes, unspecified as to length of time between rupture and onset of labor, unspecified weeks of gestation: Principal | ICD-10-CM

## 2023-07-28 DIAGNOSIS — O43893 Other placental disorders, third trimester: Secondary | ICD-10-CM | POA: Diagnosis not present

## 2023-07-28 DIAGNOSIS — F1290 Cannabis use, unspecified, uncomplicated: Secondary | ICD-10-CM | POA: Diagnosis present

## 2023-07-28 DIAGNOSIS — O26893 Other specified pregnancy related conditions, third trimester: Secondary | ICD-10-CM | POA: Diagnosis not present

## 2023-07-28 DIAGNOSIS — O43813 Placental infarction, third trimester: Secondary | ICD-10-CM | POA: Diagnosis not present

## 2023-07-28 DIAGNOSIS — O42013 Preterm premature rupture of membranes, onset of labor within 24 hours of rupture, third trimester: Secondary | ICD-10-CM | POA: Diagnosis not present

## 2023-07-28 DIAGNOSIS — R0689 Other abnormalities of breathing: Secondary | ICD-10-CM | POA: Diagnosis not present

## 2023-07-28 DIAGNOSIS — O42919 Preterm premature rupture of membranes, unspecified as to length of time between rupture and onset of labor, unspecified trimester: Secondary | ICD-10-CM | POA: Diagnosis not present

## 2023-07-28 DIAGNOSIS — Z98891 History of uterine scar from previous surgery: Secondary | ICD-10-CM

## 2023-07-28 DIAGNOSIS — O321XX2 Maternal care for breech presentation, fetus 2: Secondary | ICD-10-CM | POA: Diagnosis present

## 2023-07-28 DIAGNOSIS — O4102X9 Oligohydramnios, second trimester, other fetus: Secondary | ICD-10-CM | POA: Diagnosis not present

## 2023-07-28 DIAGNOSIS — O24419 Gestational diabetes mellitus in pregnancy, unspecified control: Secondary | ICD-10-CM | POA: Diagnosis present

## 2023-07-28 DIAGNOSIS — O99334 Smoking (tobacco) complicating childbirth: Secondary | ICD-10-CM | POA: Diagnosis not present

## 2023-07-28 DIAGNOSIS — Z3490 Encounter for supervision of normal pregnancy, unspecified, unspecified trimester: Secondary | ICD-10-CM

## 2023-07-28 LAB — CBC
HCT: 28.7 % — ABNORMAL LOW (ref 36.0–46.0)
HCT: 28.8 % — ABNORMAL LOW (ref 36.0–46.0)
Hemoglobin: 10 g/dL — ABNORMAL LOW (ref 12.0–15.0)
Hemoglobin: 10.1 g/dL — ABNORMAL LOW (ref 12.0–15.0)
MCH: 30.1 pg (ref 26.0–34.0)
MCH: 29.9 pg (ref 26.0–34.0)
MCHC: 34.8 g/dL (ref 30.0–36.0)
MCHC: 35.1 g/dL (ref 30.0–36.0)
MCV: 86.4 fL (ref 80.0–100.0)
MCV: 85.2 fL (ref 80.0–100.0)
Platelets: 156 10*3/uL (ref 150–400)
Platelets: 167 10*3/uL (ref 150–400)
RBC: 3.32 MIL/uL — ABNORMAL LOW (ref 3.87–5.11)
RBC: 3.38 MIL/uL — ABNORMAL LOW (ref 3.87–5.11)
RDW: 12.2 % (ref 11.5–15.5)
RDW: 12 % (ref 11.5–15.5)
WBC: 16.9 10*3/uL — ABNORMAL HIGH (ref 4.0–10.5)
WBC: 19 10*3/uL — ABNORMAL HIGH (ref 4.0–10.5)
nRBC: 0 % (ref 0.0–0.2)
nRBC: 0 % (ref 0.0–0.2)

## 2023-07-28 LAB — RAPID URINE DRUG SCREEN, HOSP PERFORMED
Amphetamines: NOT DETECTED
Amphetamines: NOT DETECTED
Barbiturates: NOT DETECTED
Barbiturates: NOT DETECTED
Benzodiazepines: NOT DETECTED
Benzodiazepines: NOT DETECTED
Cocaine: NOT DETECTED
Cocaine: NOT DETECTED
Opiates: NOT DETECTED
Opiates: NOT DETECTED
Tetrahydrocannabinol: NOT DETECTED
Tetrahydrocannabinol: POSITIVE — AB

## 2023-07-28 LAB — RPR: RPR Ser Ql: NONREACTIVE

## 2023-07-28 LAB — GLUCOSE, CAPILLARY
Glucose-Capillary: 93 mg/dL (ref 70–99)
Glucose-Capillary: 105 mg/dL — ABNORMAL HIGH (ref 70–99)

## 2023-07-28 LAB — TYPE AND SCREEN
ABO/RH(D): A POS
Antibody Screen: NEGATIVE

## 2023-07-28 LAB — HIV ANTIBODY (ROUTINE TESTING W REFLEX): HIV Screen 4th Generation wRfx: NONREACTIVE

## 2023-07-28 SURGERY — Surgical Case
Anesthesia: General

## 2023-07-28 MED ORDER — ENOXAPARIN SODIUM 60 MG/0.6ML IJ SOSY
60.0000 mg | PREFILLED_SYRINGE | INTRAMUSCULAR | Status: DC
  Administered 2023-07-28 – 2023-07-30 (×3): 60 mg via SUBCUTANEOUS
  Filled 2023-07-28 (×3): qty 0.6

## 2023-07-28 MED ORDER — TERBUTALINE SULFATE 1 MG/ML IJ SOLN
0.2500 mg | Freq: Once | INTRAMUSCULAR | Status: AC
Start: 2023-07-28 — End: 2023-07-28
  Administered 2023-07-28: 0.25 mg via SUBCUTANEOUS

## 2023-07-28 MED ORDER — PRENATAL MULTIVITAMIN CH
1.0000 | ORAL_TABLET | Freq: Every day | ORAL | Status: DC
  Administered 2023-07-28 – 2023-07-31 (×4): 1 via ORAL
  Filled 2023-07-28 (×4): qty 1

## 2023-07-28 MED ORDER — ACETAMINOPHEN 325 MG PO TABS: 650.0000 mg | ORAL_TABLET | ORAL | Status: DC | PRN

## 2023-07-28 MED ORDER — BUPIVACAINE HCL (PF) 0.25 % IJ SOLN
INTRAMUSCULAR | Status: DC | PRN
  Administered 2023-07-28: 30 mL

## 2023-07-28 MED ORDER — SIMETHICONE 80 MG PO CHEW: 80.0000 mg | CHEWABLE_TABLET | ORAL | Status: DC | PRN

## 2023-07-28 MED ORDER — ACETAMINOPHEN 10 MG/ML IV SOLN
INTRAVENOUS | Status: DC | PRN
  Administered 2023-07-28: 1000 mg via INTRAVENOUS

## 2023-07-28 MED ORDER — DIPHENHYDRAMINE HCL 25 MG PO CAPS: 25.0000 mg | ORAL_CAPSULE | Freq: Four times a day (QID) | ORAL | Status: DC | PRN

## 2023-07-28 MED ORDER — KETOROLAC TROMETHAMINE 30 MG/ML IJ SOLN
30.0000 mg | Freq: Four times a day (QID) | INTRAMUSCULAR | Status: AC
  Administered 2023-07-28 – 2023-07-29 (×4): 30 mg via INTRAVENOUS
  Filled 2023-07-28 (×4): qty 1

## 2023-07-28 MED ORDER — SOD CITRATE-CITRIC ACID 500-334 MG/5ML PO SOLN: 30.0000 mL | ORAL | Status: DC | PRN

## 2023-07-28 MED ORDER — PROPOFOL 500 MG/50ML IV EMUL
INTRAVENOUS | Status: DC | PRN
  Administered 2023-07-28: 150 mg via INTRAVENOUS

## 2023-07-28 MED ORDER — ACETAMINOPHEN 10 MG/ML IV SOLN: 1000.0000 mg | Freq: Once | INTRAVENOUS | Status: DC | PRN

## 2023-07-28 MED ORDER — FENTANYL CITRATE (PF) 250 MCG/5ML IJ SOLN
INTRAMUSCULAR | Status: AC
  Filled 2023-07-28: qty 5

## 2023-07-28 MED ORDER — OXYTOCIN BOLUS FROM INFUSION: 333.0000 mL | Freq: Once | INTRAVENOUS | Status: DC

## 2023-07-28 MED ORDER — LIDOCAINE-EPINEPHRINE (PF) 2 %-1:200000 IJ SOLN
INTRAMUSCULAR | Status: DC | PRN
  Administered 2023-07-28: 40 mL via INTRADERMAL

## 2023-07-28 MED ORDER — OXYCODONE-ACETAMINOPHEN 5-325 MG PO TABS: 1.0000 | ORAL_TABLET | ORAL | Status: DC | PRN

## 2023-07-28 MED ORDER — OXYCODONE-ACETAMINOPHEN 5-325 MG PO TABS: 2.0000 | ORAL_TABLET | ORAL | Status: DC | PRN

## 2023-07-28 MED ORDER — FENTANYL CITRATE (PF) 100 MCG/2ML IJ SOLN
INTRAMUSCULAR | Status: AC
  Filled 2023-07-28: qty 2

## 2023-07-28 MED ORDER — BUPIVACAINE HCL (PF) 0.25 % IJ SOLN
INTRAMUSCULAR | Status: AC
  Filled 2023-07-28: qty 30

## 2023-07-28 MED ORDER — NIFEDIPINE ER OSMOTIC RELEASE 30 MG PO TB24
30.0000 mg | ORAL_TABLET | Freq: Every day | ORAL | Status: DC
  Administered 2023-07-28 – 2023-07-31 (×4): 30 mg via ORAL
  Filled 2023-07-28 (×4): qty 1

## 2023-07-28 MED ORDER — IBUPROFEN 600 MG PO TABS
600.0000 mg | ORAL_TABLET | Freq: Four times a day (QID) | ORAL | Status: DC
  Administered 2023-07-29 – 2023-07-31 (×8): 600 mg via ORAL
  Filled 2023-07-28 (×9): qty 1

## 2023-07-28 MED ORDER — OXYCODONE HCL 5 MG PO TABS
5.0000 mg | ORAL_TABLET | ORAL | Status: DC | PRN
Start: 2023-07-28 — End: 2023-07-31
  Administered 2023-07-28 – 2023-07-29 (×3): 5 mg via ORAL
  Filled 2023-07-28 (×3): qty 1

## 2023-07-28 MED ORDER — LIDOCAINE HCL (PF) 1 % IJ SOLN: 30.0000 mL | INTRAMUSCULAR | Status: DC | PRN

## 2023-07-28 MED ORDER — LIDOCAINE HCL (PF) 1 % IJ SOLN
INTRAMUSCULAR | Status: AC
  Filled 2023-07-28: qty 30

## 2023-07-28 MED ORDER — LACTATED RINGERS IV SOLN: INTRAVENOUS | Status: DC

## 2023-07-28 MED ORDER — OXYTOCIN-SODIUM CHLORIDE 30-0.9 UT/500ML-% IV SOLN
2.5000 [IU]/h | INTRAVENOUS | Status: AC
Start: 2023-07-28 — End: 2023-07-28

## 2023-07-28 MED ORDER — ONDANSETRON HCL 4 MG/2ML IJ SOLN: 4.0000 mg | Freq: Four times a day (QID) | INTRAMUSCULAR | Status: DC | PRN

## 2023-07-28 MED ORDER — TERBUTALINE SULFATE 1 MG/ML IJ SOLN
INTRAMUSCULAR | Status: AC
  Filled 2023-07-28: qty 1

## 2023-07-28 MED ORDER — SIMETHICONE 80 MG PO CHEW
80.0000 mg | CHEWABLE_TABLET | Freq: Three times a day (TID) | ORAL | Status: DC
  Administered 2023-07-28 – 2023-07-31 (×12): 80 mg via ORAL
  Filled 2023-07-28 (×13): qty 1

## 2023-07-28 MED ORDER — FENTANYL CITRATE (PF) 100 MCG/2ML IJ SOLN
25.0000 ug | INTRAMUSCULAR | Status: DC | PRN
  Administered 2023-07-28 (×2): 50 ug via INTRAVENOUS

## 2023-07-28 MED ORDER — MEASLES, MUMPS & RUBELLA VAC IJ SOLR: 0.5000 mL | Freq: Once | INTRAMUSCULAR | Status: DC

## 2023-07-28 MED ORDER — OXYTOCIN-SODIUM CHLORIDE 30-0.9 UT/500ML-% IV SOLN: 2.5000 [IU]/h | INTRAVENOUS | Status: DC

## 2023-07-28 MED ORDER — DIBUCAINE (PERIANAL) 1 % EX OINT: 1.0000 | TOPICAL_OINTMENT | CUTANEOUS | Status: DC | PRN

## 2023-07-28 MED ORDER — CEFAZOLIN SODIUM-DEXTROSE 2-3 GM-%(50ML) IV SOLR
INTRAVENOUS | Status: DC | PRN
  Administered 2023-07-28: 2 g via INTRAVENOUS

## 2023-07-28 MED ORDER — TETANUS-DIPHTH-ACELL PERTUSSIS 5-2.5-18.5 LF-MCG/0.5 IM SUSY
0.5000 mL | PREFILLED_SYRINGE | Freq: Once | INTRAMUSCULAR | Status: AC
  Administered 2023-07-29: 0.5 mL via INTRAMUSCULAR
  Filled 2023-07-28: qty 0.5

## 2023-07-28 MED ORDER — DIPHENHYDRAMINE HCL 25 MG PO CAPS: 25.0000 mg | ORAL_CAPSULE | ORAL | Status: DC | PRN

## 2023-07-28 MED ORDER — KETOROLAC TROMETHAMINE 30 MG/ML IJ SOLN: 30.0000 mg | Freq: Four times a day (QID) | INTRAMUSCULAR | Status: DC | PRN

## 2023-07-28 MED ORDER — SENNOSIDES-DOCUSATE SODIUM 8.6-50 MG PO TABS
2.0000 | ORAL_TABLET | Freq: Every day | ORAL | Status: DC
  Administered 2023-07-29 – 2023-07-31 (×3): 2 via ORAL
  Filled 2023-07-28 (×3): qty 2

## 2023-07-28 MED ORDER — MENTHOL 3 MG MT LOZG: 1.0000 | LOZENGE | OROMUCOSAL | Status: DC | PRN

## 2023-07-28 MED ORDER — SODIUM CHLORIDE 0.9 % IV SOLN
INTRAVENOUS | Status: DC | PRN
  Administered 2023-07-28: 500 mg via INTRAVENOUS

## 2023-07-28 MED ORDER — GABAPENTIN 100 MG PO CAPS
100.0000 mg | ORAL_CAPSULE | Freq: Two times a day (BID) | ORAL | Status: DC
  Administered 2023-07-28 – 2023-07-31 (×6): 100 mg via ORAL
  Filled 2023-07-28 (×7): qty 1

## 2023-07-28 MED ORDER — SODIUM CHLORIDE 0.9% FLUSH: 3.0000 mL | INTRAVENOUS | Status: DC | PRN

## 2023-07-28 MED ORDER — DEXAMETHASONE SODIUM PHOSPHATE 10 MG/ML IJ SOLN
INTRAMUSCULAR | Status: DC | PRN
  Administered 2023-07-28 (×2): 5 mg via INTRAVENOUS

## 2023-07-28 MED ORDER — SUCCINYLCHOLINE CHLORIDE 200 MG/10ML IV SOSY
PREFILLED_SYRINGE | INTRAVENOUS | Status: DC | PRN
  Administered 2023-07-28: 140 mg via INTRAVENOUS

## 2023-07-28 MED ORDER — LACTATED RINGERS IV SOLN: INTRAVENOUS | Status: DC | PRN

## 2023-07-28 MED ORDER — WITCH HAZEL-GLYCERIN EX PADS: 1.0000 | MEDICATED_PAD | CUTANEOUS | Status: DC | PRN

## 2023-07-28 MED ORDER — TERBUTALINE SULFATE 1 MG/ML IJ SOLN: 0.2500 mg | Freq: Once | INTRAMUSCULAR | Status: DC

## 2023-07-28 MED ORDER — HYDROMORPHONE HCL 1 MG/ML IJ SOLN: 0.2000 mg | INTRAMUSCULAR | Status: DC | PRN

## 2023-07-28 MED ORDER — FUROSEMIDE 20 MG PO TABS
20.0000 mg | ORAL_TABLET | Freq: Every day | ORAL | Status: DC
  Administered 2023-07-28 – 2023-07-29 (×2): 20 mg via ORAL
  Filled 2023-07-28 (×2): qty 1

## 2023-07-28 MED ORDER — ZOLPIDEM TARTRATE 5 MG PO TABS: 5.0000 mg | ORAL_TABLET | Freq: Every evening | ORAL | Status: DC | PRN

## 2023-07-28 MED ORDER — OXYTOCIN-SODIUM CHLORIDE 30-0.9 UT/500ML-% IV SOLN
INTRAVENOUS | Status: AC
  Filled 2023-07-28: qty 500

## 2023-07-28 MED ORDER — FENTANYL CITRATE (PF) 250 MCG/5ML IJ SOLN
INTRAMUSCULAR | Status: DC | PRN
  Administered 2023-07-28 (×4): 50 ug via INTRAVENOUS
  Administered 2023-07-28: 100 ug via INTRAVENOUS
  Administered 2023-07-28: 50 ug via INTRAVENOUS

## 2023-07-28 MED ORDER — LACTATED RINGERS IV SOLN: 500.0000 mL | INTRAVENOUS | Status: DC | PRN

## 2023-07-28 MED ORDER — ACETAMINOPHEN 500 MG PO TABS
1000.0000 mg | ORAL_TABLET | Freq: Four times a day (QID) | ORAL | Status: DC
  Administered 2023-07-28 – 2023-07-31 (×13): 1000 mg via ORAL
  Filled 2023-07-28 (×13): qty 2

## 2023-07-28 MED ORDER — TRANEXAMIC ACID-NACL 1000-0.7 MG/100ML-% IV SOLN
INTRAVENOUS | Status: AC
  Filled 2023-07-28: qty 100

## 2023-07-28 MED ORDER — NALOXONE HCL 0.4 MG/ML IJ SOLN: 0.4000 mg | INTRAMUSCULAR | Status: DC | PRN

## 2023-07-28 MED ORDER — ONDANSETRON HCL 4 MG/2ML IJ SOLN
INTRAMUSCULAR | Status: AC
  Administered 2023-07-28: 4 mg
  Filled 2023-07-28: qty 2

## 2023-07-28 MED ORDER — NALOXONE HCL 4 MG/10ML IJ SOLN: 1.0000 ug/kg/h | INTRAVENOUS | Status: DC | PRN

## 2023-07-28 MED ORDER — DIPHENHYDRAMINE HCL 50 MG/ML IJ SOLN: 12.5000 mg | INTRAMUSCULAR | Status: DC | PRN

## 2023-07-28 MED ORDER — COCONUT OIL OIL: 1.0000 | TOPICAL_OIL | Status: DC | PRN

## 2023-07-28 MED ORDER — FENTANYL CITRATE PF 50 MCG/ML IJ SOSY
PREFILLED_SYRINGE | INTRAMUSCULAR | Status: AC
  Administered 2023-07-28: 50 ug
  Filled 2023-07-28: qty 1

## 2023-07-28 MED ORDER — ONDANSETRON HCL 4 MG/2ML IJ SOLN: 4.0000 mg | Freq: Three times a day (TID) | INTRAMUSCULAR | Status: DC | PRN

## 2023-07-28 MED ORDER — ONDANSETRON HCL 4 MG/2ML IJ SOLN
INTRAMUSCULAR | Status: DC | PRN
  Administered 2023-07-28: 4 mg via INTRAVENOUS

## 2023-07-28 MED ORDER — SCOPOLAMINE 1 MG/3DAYS TD PT72
1.0000 | MEDICATED_PATCH | Freq: Once | TRANSDERMAL | Status: AC
  Administered 2023-07-28: 1.5 mg via TRANSDERMAL
  Filled 2023-07-28: qty 1

## 2023-07-28 MED ORDER — MEPERIDINE HCL 25 MG/ML IJ SOLN: 6.2500 mg | INTRAMUSCULAR | Status: DC | PRN

## 2023-07-28 SURGICAL SUPPLY — 30 items
BENZOIN TINCTURE PRP APPL 2/3 (GAUZE/BANDAGES/DRESSINGS) IMPLANT
CHLORAPREP W/TINT 26 (MISCELLANEOUS) ×2 IMPLANT
CLAMP UMBILICAL CORD (MISCELLANEOUS) ×1 IMPLANT
CLOTH BEACON ORANGE TIMEOUT ST (SAFETY) ×1 IMPLANT
DRSG OPSITE POSTOP 4X10 (GAUZE/BANDAGES/DRESSINGS) ×1 IMPLANT
ELECTRODE REM PT RTRN 9FT ADLT (ELECTROSURGICAL) ×1 IMPLANT
EXTRACTOR VACUUM M CUP 4 TUBE (SUCTIONS) IMPLANT
GAUZE PAD ABD 7.5X8 STRL (GAUZE/BANDAGES/DRESSINGS) IMPLANT
GAUZE SPONGE 4X4 12PLY STRL LF (GAUZE/BANDAGES/DRESSINGS) IMPLANT
GLOVE BIO SURGEON STRL SZ7.5 (GLOVE) ×1 IMPLANT
GLOVE BIOGEL PI IND STRL 7.0 (GLOVE) ×2 IMPLANT
GLOVE BIOGEL PI IND STRL 8 (GLOVE) ×1 IMPLANT
GOWN STRL REUS W/TWL LRG LVL3 (GOWN DISPOSABLE) ×2 IMPLANT
GOWN STRL REUS W/TWL XL LVL3 (GOWN DISPOSABLE) ×1 IMPLANT
KIT ABG SYR 3ML LUER SLIP (SYRINGE) IMPLANT
NDL HYPO 25X5/8 SAFETYGLIDE (NEEDLE) IMPLANT
NEEDLE HYPO 25X5/8 SAFETYGLIDE (NEEDLE) IMPLANT
NS IRRIG 1000ML POUR BTL (IV SOLUTION) ×1 IMPLANT
PACK C SECTION WH (CUSTOM PROCEDURE TRAY) ×1 IMPLANT
PAD OB MATERNITY 4.3X12.25 (PERSONAL CARE ITEMS) ×1 IMPLANT
RTRCTR C-SECT PINK 25CM LRG (MISCELLANEOUS) ×1 IMPLANT
STRIP CLOSURE SKIN 1/2X4 (GAUZE/BANDAGES/DRESSINGS) IMPLANT
SUT MNCRL 0 VIOLET CTX 36 (SUTURE) ×2 IMPLANT
SUT VIC AB 0 CTX36XBRD ANBCTRL (SUTURE) ×1 IMPLANT
SUT VIC AB 2-0 CT1 TAPERPNT 27 (SUTURE) ×1 IMPLANT
SUT VIC AB 4-0 KS 27 (SUTURE) ×1 IMPLANT
TAPE CLOTH SURG 4X10 WHT LF (GAUZE/BANDAGES/DRESSINGS) IMPLANT
TOWEL OR 17X24 6PK STRL BLUE (TOWEL DISPOSABLE) ×1 IMPLANT
TRAY FOLEY W/BAG SLVR 14FR LF (SET/KITS/TRAYS/PACK) ×1 IMPLANT
WATER STERILE IRR 1000ML POUR (IV SOLUTION) ×1 IMPLANT

## 2023-07-28 NOTE — ED Provider Notes (Signed)
 [place] EMERGENCY DEPARTMENT AT [facility] Provider Note   CSN: [id number] Arrival date & time: 07/28/23  0014     History  Chief Complaint  Patient presents with   Labor Eval    Shelly Mccann is a 28 y.o. female.  28 yo G1P0 at approximately [redacted]w[redacted]d [employer] twins, seen by [facility]) here with LOF and intermittent episodes of pain and thinks she might be in labor. Feeling fetal movement still. No blood.         Home Medications Prior to Admission medications   Medication Sig Start Date End Date Taking? Authorizing Provider  Accu-Chek Softclix Lancets lancets Use as instructed to check blood sugar 4 times daily 07/10/23   Wendelyn Halter, MD  aspirin  EC 81 MG tablet Take 1 tablet (81 mg total) by mouth daily. Swallow whole. 05/11/23   Ferd Householder, CNM  Blood Glucose Monitoring Suppl (ACCU-CHEK GUIDE ME) w/Device KIT 1 each by Does not apply route 4 (four) times daily. 07/10/23   Wendelyn Halter, MD  Continuous Glucose Receiver (FREESTYLE LIBRE 3 READER) DEVI Use as directed to check glucose daily 07/23/23   Ozan, Jennifer, DO  Continuous Glucose Sensor (FREESTYLE LIBRE 3 SENSOR) MISC Use as directed to monitor glucose daily 07/23/23   Keene Pastures, DO  glucose blood test strip Use as instructed to check blood sugar four times daily 07/10/23   Wendelyn Halter, MD  Prenatal Vit-Fe Fumarate-FA (MULTIVITAMIN-PRENATAL) 27-0.8 MG TABS tablet Take 1 tablet by mouth daily at 12 noon.    [provider]  promethazine  (PHENERGAN ) 25 MG tablet Take 0.5-1 tablets (12.5-25 mg total) by mouth every 6 (six) hours as needed. Patient not taking: Reported on 07/02/2023 05/11/23   Ferd Householder, CNM  terconazole  (TERAZOL 7 ) 0.4 % vaginal cream Place 1 applicator vaginally at bedtime. Patient not taking: Reported on 07/02/2023 06/18/23   Wendelyn Halter, MD      Allergies    Patient has no known allergies.    Review of Systems   Review of Systems  Physical Exam Updated  Vital Signs BP (!) 141/83   Pulse 73   Resp 18   Ht 5\' 9"  (1.753 m)   Wt 112 kg   LMP 12/10/2022   SpO2 98%   BMI 36.46 kg/m  Physical Exam Vitals and nursing note reviewed.  Constitutional:      Appearance: She is well-developed.  HENT:     Head: Normocephalic and atraumatic.  Eyes:     Pupils: Pupils are equal, round, and reactive to light.  Cardiovascular:     Rate and Rhythm: Normal rate and regular rhythm.  Pulmonary:     Effort: No respiratory distress.     Breath sounds: No stridor.  Abdominal:     General: There is no distension.  Musculoskeletal:        General: No swelling or tenderness. Normal range of motion.     Cervical back: Normal range of motion.  Skin:    General: Skin is warm and dry.  Neurological:     General: No focal deficit present.     Mental Status: She is alert.     ED Results / Procedures / Treatments   Labs (all labs ordered are listed, but only abnormal results are displayed) Labs Reviewed - No data to display  EKG None  Radiology No results found.  Procedures .Critical Care  Performed by: Eve Hinders, MD Authorized by: Eve Hinders, MD  Critical care provider statement:    Critical care time (minutes):  30   Critical care was necessary to treat or prevent imminent or life-threatening deterioration of the following conditions: active labor.   Critical care was time spent personally by me on the following activities:  Development of treatment plan with patient or surrogate, discussions with consultants, evaluation of patient's response to treatment, examination of patient, ordering and review of laboratory studies, ordering and review of radiographic studies, ordering and performing treatments and interventions, pulse oximetry, re-evaluation of patient's condition and review of old charts     Medications Ordered in ED Medications  terbutaline  (BRETHINE ) injection 0.25 mg (has no administration in time range)  fentaNYL   (SUBLIMAZE ) 50 MCG/ML injection (50 mcg  Given 07/28/23 0032)  ondansetron  (ZOFRAN ) 4 MG/2ML injection (4 mg  Given 07/28/23 0032)  terbutaline  (BRETHINE ) injection 0.25 mg (0.25 mg Subcutaneous Given 07/28/23 0041)    ED Course/ Medical Decision Making/ A&P                                 Medical Decision Making Risk Prescription drug management. Decision regarding hospitalization.  Patient with possible preterm labor. Cervical check with nurse Ira Mann as chaperone and difficult feel how dilated but can feel the skull. Does have fluid c/w likely amniotic. Contractions a few minuts apart. FHR appropriate. D/w Dr. Racheal Buddle and thinks there's time to get to women's. [facility] here for transport. Terbutaline  given. Dr. Racheal Buddle did not feel betamethasone is necessary at this time. Will transfer with nurse. Patient in active labor but after discussion with Obstetrics I feel the risk of transfer is outweighed by the benefit of being at a birthing center for high risk delivery. Nursing to accompany. ER adult and peds docs aware in case of precipitous delivery. Ob rapid response aware to meet at ambulance for same reason.    Final Clinical Impression(s) / ED Diagnoses Final diagnoses:  Amniotic fluid leaking  Preterm labor in third trimester without delivery    Rx / DC Orders ED Discharge Orders     None         Wilmarie Sparlin, Reymundo Caulk, MD 07/28/23 [phone]

## 2023-07-28 NOTE — Lactation Note (Signed)
 This note was copied from a baby's chart.  NICU Lactation Consultation Note  Patient Name: Shelly Mccann [id number] Date: 07/28/2023 Age:28 hours  Reason for consult: NICU baby; Infant < 5lbs; Initial assessment; Preterm <34wks; Maternal endocrine disorder; Other (Comment); Multiple gestation; Primapara; 1st time breastfeeding (Maternal tobacco and THC use, LPNC) Type of Endocrine Disorder?: Diabetes (GDM)  SUBJECTIVE Visited with family of 28 24/53 weeks old NICU twin female; baby girl was admitted due to prematurity. Ms. Frie is a P1 and has already started pumping, praised her for her efforts. Her plan is to do direct breastfeeding along with pumping and bottle feeding with breastmilk/donor milk and formula post- NICU discharge. Provided a pumping band in size "L" but unable to do the fitting as she was leaving the unit when LC returned. Reviewed pumping schedule, pumping log, lactogenesis II and anticipatory guidelines.  OBJECTIVE Infant data: Mother's Current Feeding Choice: Breast Milk and Donor Milk  O2 Device: Room Air  Maternal data: [id number] Vaginal, Spontaneous Has patient been taught Hand Expression?: No Hand Expression Comments: declined on 07/28/2023 Significant Breast History:: (+) breast changes during the pregnancy Current breast feeding challenges:: NICU admission Does the patient have breastfeeding experience prior to this delivery?: No Pumping frequency: initiated pumping at 10 hours post-partum Pumped volume: 3 mL Flange Size: 21 Hands-free pumping top sizes: Large Martina Sledge) Risk factor for low/delayed milk supply:: primipara, prematurity, infant separation, < 5 lbs  Pump:  (Stork pump referral sent on 07/28/2023)  ASSESSMENT Infant: Feeding Status: Scheduled 9-12-3-6 Feeding method: Tube/Gavage (Bolus)  Maternal: Milk volume: Normal  INTERVENTIONS/PLAN Interventions: Interventions: Breast feeding basics reviewed; Coconut oil; DEBP; Education; NICU  Pumping Log; CDC Guidelines for Breast Pump Cleaning; LC Services brochure Tools: Pump; Flanges; Hands-free pumping top; Coconut oil Pump Education: Setup, frequency, and cleaning; Milk Storage  Plan: STS once able to Pump both breasts on initiate mode every 3 hours for 15 minutes; ideally 8 pumping sessions/24 hours Verify Stork pump issuance  Female visitor present. All questions and concerns answered, family to contact [facility] services PRN.  Consult Status: NICU follow-up NICU Follow-up type: New admission follow up   Ayyan Sites S Clementine Cutting 07/28/2023, 7:01 PM

## 2023-07-28 NOTE — H&P (Addendum)
 OBSTETRIC ADMISSION HISTORY AND PHYSICAL  Shelly Mccann is a 28 y.o. female [phone] with IUP at [redacted]w[redacted]d by LMP presenting for SROM during intercourse. Was transferred via EMS from [facility] to MAU. On arrival to MAU was fund to be complete and involuntarily pushing so immediately taken to labor and delivery. Fetus A was vertex, fetus B was frank breech.  She plans on breast feeding. She request pp IUD for birth control. She received her prenatal care at [facility]   Dating: By LMP --->  Estimated Date of Delivery: 09/16/23  Sono:    @[redacted]w[redacted]d , CWD, normal anatomy, Twin A cephalic, Twin B breech presentation, fundal lie, Twin A 1234 g, 18% EFW, twin B 1551 g, 78%   Prenatal History/Complications: limited prenatal care, di di twin gestation, GDM  Past Medical History: Past Medical History:  Diagnosis Date   Hidradenitis     Past Surgical History: Past Surgical History:  Procedure Laterality Date   TONSILLECTOMY N/A 10/03/2013   Procedure: TONSILLECTOMY;  Surgeon: Janita Mellow, MD;  Location: [facility];  Service: ENT;  Laterality: N/A;    Obstetrical History: OB History     Gravida  4   Para  1   Term      Preterm  1   AB  3   Living  2      SAB  1   IAB  2   Ectopic      Multiple  1   Live Births  2           Social History Social History   Socioeconomic History   Marital status: Single    Spouse name: Not on file   Number of children: Not on file   Years of education: Not on file   Highest education level: Not on file  Occupational History   Not on file  Tobacco Use   Smoking status: Never   Smokeless tobacco: Never  Vaping Use   Vaping status: Every Day  Substance and Sexual Activity   Alcohol use: Not Currently   Drug use: Yes    Types: Marijuana    Comment: occasionally   Sexual activity: Yes    Birth control/protection: None  Other Topics Concern   Not on file  Social History Narrative   Not on file   Social  Drivers of Health   Financial Resource Strain: Low Risk  (05/11/2023)   Overall Financial Resource Strain (CARDIA)    Difficulty of Paying Living Expenses: Not hard at all  Recent Concern: Financial Resource Strain - Medium Risk (02/23/2023)   Received from [facility]   Overall Financial Resource Strain (CARDIA)    Difficulty of Paying Living Expenses: Somewhat hard  Food Insecurity: No Food Insecurity (05/11/2023)   Hunger Vital Sign    Worried About Running Out of Food in the Last Year: Never true    Ran Out of Food in the Last Year: Never true  Transportation Needs: No Transportation Needs (05/11/2023)   PRAPARE - Administrator, Civil Service (Medical): No    Lack of Transportation (Non-Medical): No  Physical Activity: Sufficiently Active (05/11/2023)   Exercise Vital Sign    Days of Exercise per Week: 5 days    Minutes of Exercise per Session: 100 min  Stress: No Stress Concern Present (05/11/2023)   [employer] of Occupational Health - Occupational Stress Questionnaire    Feeling of Stress : Not at all  Recent Concern: Stress -  Stress Concern Present (02/23/2023)   Received from [facility] of Occupational Health - Occupational Stress Questionnaire    Feeling of Stress : To some extent  Social Connections: Moderately Isolated (05/11/2023)   Social Connection and Isolation Panel [NHANES]    Frequency of Communication with Friends and Family: More than three times a week    Frequency of Social Gatherings with Friends and Family: Once a week    Attends Religious Services: Never    Database administrator or Organizations: No    Attends Engineer, structural: Never    Marital Status: Living with partner    Family History: Family History  Problem Relation Age of Onset   Healthy Mother    Healthy Father    Alcohol abuse Paternal Grandfather    Hypertension Paternal Grandfather     Allergies: No Known Allergies  Medications Prior  to Admission  Medication Sig Dispense Refill Last Dose/Taking   Accu-Chek Softclix Lancets lancets Use as instructed to check blood sugar 4 times daily 100 each 12    aspirin  EC 81 MG tablet Take 1 tablet (81 mg total) by mouth daily. Swallow whole. 90 tablet 3    Blood Glucose Monitoring Suppl (ACCU-CHEK GUIDE ME) w/Device KIT 1 each by Does not apply route 4 (four) times daily. 1 kit 0    Continuous Glucose Receiver (FREESTYLE LIBRE 3 READER) DEVI Use as directed to check glucose daily 1 each 0    Continuous Glucose Sensor (FREESTYLE LIBRE 3 SENSOR) MISC Use as directed to monitor glucose daily 2 each 6    glucose blood test strip Use as instructed to check blood sugar four times daily 100 each 12    Prenatal Vit-Fe Fumarate-FA (MULTIVITAMIN-PRENATAL) 27-0.8 MG TABS tablet Take 1 tablet by mouth daily at 12 noon.      promethazine  (PHENERGAN ) 25 MG tablet Take 0.5-1 tablets (12.5-25 mg total) by mouth every 6 (six) hours as needed. (Patient not taking: Reported on 07/02/2023) 30 tablet 6    terconazole  (TERAZOL 7 ) 0.4 % vaginal cream Place 1 applicator vaginally at bedtime. (Patient not taking: Reported on 07/02/2023) 45 g 0      Review of Systems   All systems reviewed and negative except as stated in HPI  Blood pressure (!) 141/83, pulse 73, resp. rate 18, height 5\' 9"  (1.753 m), weight 112 kg, last menstrual period 12/10/2022, SpO2 98%, unknown if currently breastfeeding. General appearance: alert and severe distress Lungs: clear to auscultation bilaterally Heart: regular rate and rhythm Abdomen: soft, non-tender; bowel sounds normal Pelvic: complete and plus 3 Extremities: Homans sign is negative, no sign of DVT   Prenatal labs: ABO, Rh: A/Positive/-- (02/03 1206) Antibody: Negative (02/03 1206) Rubella: 3.31 (02/03 1206) RPR: Non Reactive (02/03 1206)  HBsAg: Negative (02/03 1206)  HIV: Non Reactive (02/03 1206)  GBS:     No results found for: "GBS" GTT abnormal  Genetic  screening  neg Anatomy US  IUGR   There is no immunization history on file for this patient.  Prenatal Transfer Tool  Maternal Diabetes: Yes:  Diabetes Type:  Diet controlled Genetic Screening: Normal Maternal Ultrasounds/Referrals: IUGR Fetal Ultrasounds or other Referrals:  Referred to Materal Fetal Medicine  Maternal Substance Abuse:  Yes:  Type: Smoker, Marijuana Significant Maternal Medications:  None Significant Maternal Lab Results: None Number of Prenatal Visits:greater than 3 verified prenatal visits Other Comments:  None   Results for orders placed or performed during the hospital encounter of  07/28/23 (from the past 24 hours)  Rapid urine drug screen (hospital performed)   Collection Time: 07/28/23  1:30 AM  Result Value Ref Range   Opiates NONE DETECTED NONE DETECTED   Cocaine NONE DETECTED NONE DETECTED   Benzodiazepines NONE DETECTED NONE DETECTED   Amphetamines NONE DETECTED NONE DETECTED   Tetrahydrocannabinol NONE DETECTED NONE DETECTED   Barbiturates NONE DETECTED NONE DETECTED    Patient Active Problem List   Diagnosis Date Noted   Pregnant 07/28/2023   Preterm labor in third trimester with preterm delivery 07/28/2023   Transverse lie of fetus, fetus 2 07/28/2023   S/P cesarean section 07/28/2023   Gestational diabetes 07/28/2023   Dichorionic diamniotic twin gestation 05/11/2023   Marijuana use 05/11/2023   Vapes nicotine containing substance 05/11/2023   Supervision of high-risk pregnancy 05/07/2023    Assessment/Plan:  Dannya K Statzer is a 28 y.o. [id number] at [redacted]w[redacted]d here for PROM  #Labor:progressing spontaneously, discussed with patient that fetus A is cephalic but fetus B is breech so plan is to deliver A vaginally and attempt to flip B cephalic if possible. Discuss that cesarean delivery may be necessary and patient understands these risks. Called Dr Adriana Hopping in for assistance.  #Pain: None at this time #FWB: Cat 1 for both #GBS status:   unknown #Feeding: Breastmilk  and Formula #Reproductive Life planning: IUD Mirena at postpartum visit  #Circ:  yes  Ferdie Housekeeper, MD  07/28/2023, 3:13 AM

## 2023-07-28 NOTE — ED Triage Notes (Signed)
 Pt states she was having sex and a gush of fluid came from vagina and then contractions started.

## 2023-07-28 NOTE — Anesthesia Procedure Notes (Addendum)
 Procedure Name: Intubation Date/Time: 07/28/2023 2:08 AM  Performed by: Bernell Brigham, CRNAPre-anesthesia Checklist: Patient identified, Emergency Drugs available, Suction available and Patient being monitored Patient Re-evaluated:Patient Re-evaluated prior to induction Oxygen Delivery Method: Circle System Utilized Preoxygenation: Pre-oxygenation with 100% oxygen Induction Type: IV induction, Rapid sequence and Cricoid Pressure applied Laryngoscope Size: Glidescope and 4 Grade View: Grade I Tube type: Oral Tube size: 7.0 mm Number of attempts: 1 Airway Equipment and Method: Stylet and Oral airway Placement Confirmation: ETT inserted through vocal cords under direct vision, positive ETCO2 and breath sounds checked- equal and bilateral Secured at: 22 cm Tube secured with: Tape Dental Injury: Teeth and Oropharynx as per pre-operative assessment

## 2023-07-28 NOTE — Lactation Note (Signed)
 This note was copied from a baby's chart.  NICU Lactation Consultation Note  Patient Name: Shelly Mccann NFAOZ'H Date: 07/28/2023 Age:28 hours  Reason for consult: Initial assessment; Primapara; 1st time breastfeeding; Infant < 5lbs; NICU baby; Multiple gestation; Preterm <34wks; Maternal endocrine disorder; Other (Comment) (Maternal tobacco and THC use, LPNC) Type of Endocrine Disorder?: Diabetes (GDM)  SUBJECTIVE Visited with family of 8 51/35 weeks old NICU twin female; baby BOY was admitted due to prematurity. Shelly Mccann is a P1 and has already started pumping, praised her for her efforts. Her plan is to do direct breastfeeding along with pumping and bottle feeding with breastmilk/donor milk and formula post- NICU discharge. Provided a pumping band in size "L" but unable to do the fitting as she was leaving the unit when LC returned. Reviewed pumping schedule, pumping log, lactogenesis II and anticipatory guidelines.  OBJECTIVE Infant data: Mother's Current Feeding Choice: Breast Milk and Donor Milk  O2 Device: (S) Room Air FiO2 (%): 21 %  Maternal data: [id number] C-Section, Low Transverse Has patient been taught Hand Expression?: No Hand Expression Comments: declined on 07/28/2023 Significant Breast History:: (+) breast changes during the pregnancy Current breast feeding challenges:: NICU admission Does the patient have breastfeeding experience prior to this delivery?: No Pumping frequency: initiated pumping at 10 hours post-partum Pumped volume: 3 mL Flange Size: 21 Hands-free pumping top sizes: Large Shelly Mccann) Risk factor for low/delayed milk supply:: primipara, prematurity, infant separation, < 5 lbs  Pump:  (Stork pump referral sent on 07/28/2023)  ASSESSMENT Infant: Feeding Status: Scheduled 8-11-2-5 Feeding method: Tube/Gavage (Bolus)  Maternal: Milk volume: Normal  INTERVENTIONS/PLAN Interventions: Interventions: Breast feeding basics reviewed; Coconut oil; DEBP;  Education; [employer] brochure; CDC Guidelines for Breast Pump Cleaning; NICU Pumping Log Tools: Pump; Flanges; Hands-free pumping top; Coconut oil Pump Education: Setup, frequency, and cleaning; Milk Storage  Plan: STS once able to Pump both breasts on initiate mode every 3 hours for 15 minutes; ideally 8 pumping sessions/24 hours Verify Stork pump issuance  Female visitor present. All questions and concerns answered, family to contact [facility] services PRN.  Consult Status: NICU follow-up NICU Follow-up type: New admission follow up   Shelly Mccann 07/28/2023, 7:01 PM

## 2023-07-28 NOTE — Discharge Summary (Signed)
 Postpartum Discharge Summary  Date of Service updated: 07/31/23     Patient Name: Shelly Mccann DOB: October 11, 1995 MRN: [id number]  Date of admission: 07/28/2023 Delivery date:   Shelly Mccann [[id number]]  07/28/2023    Shelly Mccann [[id number]]  07/28/2023 Delivering provider:    [name], GirlA Mccann [[id number]]  Shelly Mccann [[id number]]  Shelly Mccann Date of discharge: 07/31/2023  Admitting diagnosis: Amniotic fluid leaking [O42.90] Preterm labor in third trimester without delivery [O60.03] Pregnant [Z34.90] Intrauterine pregnancy: [redacted]w[redacted]d     Secondary diagnosis:  Principal Problem:   Pregnant Active Problems:   Preterm labor in third trimester with preterm delivery   Transverse lie of fetus, fetus 2   Mccann/P cesarean section   Gestational diabetes  Additional problems: None    Discharge diagnosis: Preterm Pregnancy Delivered and GDM A1                                              Post partum procedures: none Augmentation: N/A Complications: None  Hospital course: Onset of Labor With Vaginal Delivery      28 y.o. yo [phone] at [redacted]w[redacted]d was admitted in Active Labor on 07/28/2023. Labor course was complicated by SVD baby A then NR FHR and C-section due to this and transverse lie of baby B. Please see op note for further details.  Membrane Rupture Time/Date:    Shelly Mccann [[id number]]  1:56 AM    Shelly Mccann [[id number]]  1:56 AM,   Shelly Mccann [[id number]]  07/28/2023    Shelly Mccann [[id number]]  07/28/2023  Delivery Method:   Shelly Mccann [[id number]]  Vaginal, Spontaneous    Shelly Mccann [[id number]]  C-Section, Low Transverse Operative Delivery:N/A Episiotomy:    Shelly Mccann [[id number]]  None    Shelly Mccann [[id number]]  None Lacerations:     Shelly Mccann [[id number]]  None    Shelly Mccann [[id number]]  None Patient had an uncomplicated  postpartum course   She is ambulating, tolerating a regular diet, passing flatus, and urinating well. Patient is discharged home in stable condition on 07/31/23.  Newborn Data: Birth date:   Shelly Mccann [[id number]]  07/28/2023    Shelly Mccann [[id number]]  07/28/2023 Birth time:   Shelly Mccann [[id number]]  1:45 AM    Shelly Mccann [[id number]]  2:11 AM Gender:   Shelly Mccann [[id number]]  Female    Shelly Mccann [[id number]]  Female Living status:   Shelly Mccann [[id number]]  Living    Shelly Mccann [[id number]]  Living Apgars:   Shelly Mccann [[id number]]  7    Shelly Mccann [[id number]]  6 ,   Shelly Mccann [[id number]]  9    Shelly Mccann [[id number]]  8  Weight:   Shelly Mccann [[id number]]  1480 g    Shelly Mccann [[id number]]  2010 g  Magnesium Sulfate received: No BMZ received: No Rhophylac:N/A MMR:N/A T-DaP: unknown Flu: N/A RSV Vaccine received: No Transfusion:No  Immunizations received: Immunization History  Administered Date(Mccann) Administered   Tdap 07/29/2023    Physical exam  Vitals:   07/30/23 0900 07/30/23 1617 07/30/23 2155 07/31/23 0958  BP: 130/68 128/82 137/86 130/75  Pulse: (!) 58 60 63 (!) 56  Resp: 16  18 18 18   Temp: 98.4 F (36.9 C) 98.4 F (36.9 C) 98.3 F (36.8 C) 98 F (36.7 C)  TempSrc: Oral Oral Oral Oral  SpO2:  100% 100% 100%  Weight:      Height:       General: alert, cooperative, and no distress Lochia: appropriate Uterine Fundus: firm Incision: Healing well with no significant drainage DVT Evaluation: No evidence of DVT seen on physical exam. Bilateral 2+ lower extremity edema Labs: Lab Results  Component Value Date   WBC 19.0 (H) 07/28/2023   HGB 10.1 (L) 07/28/2023   HCT 28.8 (L) 07/28/2023   MCV 85.2 07/28/2023   PLT 167 07/28/2023      Latest Ref Rng & Units 07/29/2023    4:58 AM  CMP  Glucose 70 - 99 mg/dL 82   BUN  6 - 20 mg/dL 5   Creatinine 1.61 - 0.96 mg/dL 0.45   Sodium 409 - 811 mmol/L 138   Potassium 3.5 - 5.1 mmol/L 3.3   Chloride 98 - 111 mmol/L 107   CO2 22 - 32 mmol/L 22   Calcium 8.9 - 10.3 mg/dL 8.3   Total Protein 6.5 - 8.1 g/dL 4.5   Total Bilirubin 0.0 - 1.2 mg/dL 0.4   Alkaline Phos 38 - 126 U/L 62   AST 15 - 41 U/L 19   ALT 0 - 44 U/L 10    Edinburgh Score:    07/29/2023    1:23 AM  Edinburgh Postnatal Depression Scale Screening Tool  I have been able to laugh and see the funny side of things. 0  I have looked forward with enjoyment to things. 0  I have blamed myself unnecessarily when things went wrong. 1  I have been anxious or worried for no good reason. 0  I have felt scared or panicky for no good reason. 0  Things have been getting on top of me. 1  I have been so unhappy that I have had difficulty sleeping. 0  I have felt sad or miserable. 0  I have been so unhappy that I have been crying. 0  The thought of harming myself has occurred to me. 0  Edinburgh Postnatal Depression Scale Total 2   No data recorded  After visit meds:  Allergies as of 07/31/2023   No Known Allergies      Medication List     STOP taking these medications    Accu-Chek Guide Me w/Device Kit   Accu-Chek Softclix Lancets lancets   aspirin  EC 81 MG tablet   FreeStyle Libre 3 Reader [employer] 3 Sensor Misc   glucose blood test strip   multivitamin-prenatal 27-0.8 MG Tabs tablet   promethazine  25 MG tablet Commonly known as: PHENERGAN    terconazole  0.4 % vaginal cream Commonly known as: TERAZOL 7        TAKE these medications    furosemide  20 MG tablet Commonly known as: LASIX  Take 1 tablet (20 mg total) by mouth daily. Start taking on: August 01, 2023   ibuprofen  600 MG tablet Commonly known as: ADVIL  Take 1 tablet (600 mg total) by mouth every 6 (six) hours.   NIFEdipine  30 MG 24 hr tablet Commonly known as: ADALAT  CC Take 1 tablet (30 mg total) by  mouth daily. Start taking on: August 01, 2023   oxyCODONE  5 MG immediate release tablet Commonly known as: Oxy IR/ROXICODONE  Take 1-2 tablets (5-10 mg total) by mouth every 4 (four) hours as needed for  moderate pain (pain score 4-6).   potassium chloride  SA 20 MEQ tablet Commonly known as: KLOR-CON  M Take 1 tablet (20 mEq total) by mouth daily. Start taking on: August 01, 2023         Discharge home in stable condition Infant Feeding: Breast Infant Disposition:NICU Discharge instruction: per After Visit Summary and Postpartum booklet. Activity: Advance as tolerated. Pelvic rest for 6 weeks.  Diet: routine diet Future Appointments: Future Appointments  Date Time Provider Department Center  08/04/2023 11:10 AM Wendelyn Halter, MD [facility]  09/03/2023  8:30 AM [facility]  09/03/2023  9:10 AM Eure, Sixto Duhamel, MD [facility]   Follow up Visit:  Follow-up Information     [facility] Follow up in 1 week(Mccann).   Specialty: Obstetrics and Gynecology Why: postop check, they will call you with an appointment Contact information: [address] [place]  [address] [phone]                 Please schedule this patient for a In person postpartum visit in 4 weeks with the following provider: Any provider. Additional Postpartum F/U:2 hour GTT and Incision check 1 week 2 hour GTT at postpartum visit not BP check High risk pregnancy complicated by: GDM and twins Delivery mode:     Dynasti, Kerman Mayan [[id number]]  Vaginal, Spontaneous    Vernessa, Likes Elham [[id number]]  C-Section, Low Transverse Anticipated Birth Control:  IUD   07/31/2023 Abigail Abler, MD

## 2023-07-28 NOTE — Anesthesia Postprocedure Evaluation (Signed)
 Anesthesia Post Note  Patient: Shelly Mccann  Procedure(s) Performed: CESAREAN DELIVERY     Patient location during evaluation: PACU Anesthesia Type: General Level of consciousness: awake and alert Pain management: pain level controlled Vital Signs Assessment: post-procedure vital signs reviewed and stable Respiratory status: spontaneous breathing, nonlabored ventilation, respiratory function stable and patient connected to nasal cannula oxygen Cardiovascular status: blood pressure returned to baseline and stable Postop Assessment: no apparent nausea or vomiting Anesthetic complications: no   No notable events documented.                 Aalia Greulich D Emersen Carroll

## 2023-07-28 NOTE — Op Note (Addendum)
 Preoperative Diagnosis:  DC/DA twin IUP @ [redacted]w[redacted]d, PTL s/p SVD of baby A, smoker, transverse presentation of baby B and Non-reassuring FHR, GDM, polyhydramnios of baby B.  Postoperative Diagnosis:  Same  Procedure: Primary low transverse cesarean section  Surgeon: Tiffany Foerster, M.D.  Assistant: Victor Cresenzo, MD An experienced assistant was required given the standard of surgical care given the complexity of the case.  This assistant was needed for exposure, dissection, suctioning, retraction, instrument exchange, assisting with delivery with administration of fundal pressure, and for overall help during the procedure.  Findings: Viable Female infant, APGAR (1 MIN):    Kaylia, Winborne Janan [[id number]]  7    Kanady, BoyB Senia [[id number]]     APGAR (5 MINS):    Clear, GirlA Kathleena [[id number]]  [address] [place] [[id number]]     Weight pending and B apgars pending Transverse presentation, Nuchal cord x 2  Estimated blood loss: 1000 cc  Complications: None known  Specimens: Placenta to pathology  Reason for procedure: Briefly, the patient is a 28 y.o. [id number] [redacted]w[redacted]d who presents for PTL. She is s/p SVD of baby A. She was not medicated. Large bag of waters felt on baby B. AROM clear fluid. FHR in the 80s, Attempt to find feet and delivered arm instead. Pt. Was unable to tolerate further attempts at vaginal delivery and asked for C-section.  Procedure: Patient is a to the OR where spinal analgesia was administered. She was then placed in a supine position with left lateral tilt. She received 2 g of Ancef  and Azithromycin  and SCDs were in place. A timeout was performed. She was prepped and draped in the usual sterile fashion. A Foley catheter was placed in the bladder. A knife was then used to make a Pfannenstiel incision. This incision was carried out to underlying fascia which was divided in the midline with the knife. The incision was extended laterally, sharply.  The rectus was  divided in the midline.  The peritoneal cavity was entered bluntly.  Alexis retractor was placed inside the incision.  A knife was used to make a low transverse incision on the uterus. This incision was carried down to the amniotic cavity was entered. Fetus was in transverse lie and brought out of the incision breech without difficulty. Cord was clamped x 2 and cut. Infant taken to waiting nurse.  Cord pH and cord blood was obtained. Clamp on baby A cord removed from vagina and cord blood for baby A obtained. Cord clamp on baby A cord. Placenta was delivered from the uterus.  Uterus was cleaned with dry lap pads. Uterine incision closed with 0 Monocryl suture in a running fashion. A second layer of 0 Monocryl in an imbricating fashion was used to achieve hemostasis. Alexis retractor was removed from the abdomen. Peritoneal closure was done with 0 Monocryl suture.  Fascia is closed with 0 Vicryl suture in a running fashion. Subcutaneous tissue infused with 30cc 0.25% Marcaine .  Subcutaneous closure was performed with 0 plain suture.  Skin closed using 3-0 Vicryl on a Keith needle.  Steri strips applied, followed by pressure dressing.  All instrument, needle and lap counts were correct x 2.  Patient was awake and taken to PACU stable.  Infant to NICU.  Maisie Scotland PrattMD 07/28/2023 2:41 AM

## 2023-07-28 NOTE — Transfer of Care (Signed)
 Immediate Anesthesia Transfer of Care Note  Patient: Shelly Mccann  Procedure(s) Performed: CESAREAN DELIVERY  Patient Location: PACU  Anesthesia Type:General  Level of Consciousness: awake, alert , and oriented  Airway & Oxygen Therapy: Patient Spontanous Breathing  Post-op Assessment: Report given to RN and Post -op Vital signs reviewed and stable  Post vital signs: Reviewed and stable  Last Vitals:  Vitals Value Taken Time  BP 149/83 07/28/23 0315  Temp    Pulse 79 07/28/23 0316  Resp 16 07/28/23 0316  SpO2 100 % 07/28/23 0316  Vitals shown include unfiled device data.  Last Pain:  Vitals:   07/28/23 0017  PainSc: 10-Worst pain ever         Complications: No notable events documented.

## 2023-07-28 NOTE — Progress Notes (Signed)
 0015- call from [facility] ED about pt. Pt a G4P0 at 57w6ds. According to report, pt stated that she was having intercourse and felt a gush of fluid. Told pt states pain is a 10 out of 10. Cristine Done Placed pt on monitors [phone]- Provider notified. Order for terb and to transfer pt to MAU to confirm rupture. 36Cristine Done aware of orders and told to adjust monitors to trace both babies. 5784- Pt removed from monitors.

## 2023-07-28 NOTE — Anesthesia Preprocedure Evaluation (Signed)
 Anesthesia Evaluation  Patient identified by MRN, date of birth, ID band Patient awake    Reviewed: Unable to perform ROS - Chart review onlyPreop documentation limited or incomplete due to emergent nature of procedure.  Airway Mallampati: Unable to assess       Dental  (+) Teeth Intact   Pulmonary    breath sounds clear to auscultation       Cardiovascular negative cardio ROS  Rhythm:Regular Rate:Normal     Neuro/Psych    GI/Hepatic Neg liver ROS,,,  Endo/Other  negative endocrine ROS    Renal/GU negative Renal ROS     Musculoskeletal   Abdominal   Peds  Hematology   Anesthesia Other Findings   Reproductive/Obstetrics (+) Pregnancy                              Anesthesia Physical Anesthesia Plan  ASA: 2 and emergent  Anesthesia Plan: General   Post-op Pain Management:    Induction: Rapid sequence and Cricoid pressure planned  PONV Risk Score and Plan: 4 or greater and Ondansetron , Dexamethasone  and Treatment may vary due to age or medical condition  Airway Management Planned: Oral ETT and Video Laryngoscope Planned  Additional Equipment: None  Intra-op Plan:   Post-operative Plan: Extubation in OR  Informed Consent: I have reviewed the patients History and Physical, chart, labs and discussed the procedure including the risks, benefits and alternatives for the proposed anesthesia with the patient or authorized representative who has indicated his/her understanding and acceptance.     Only emergency history available and History available from chart only  Plan Discussed with: CRNA  Anesthesia Plan Comments:          Anesthesia Quick Evaluation

## 2023-07-29 LAB — COMPREHENSIVE METABOLIC PANEL WITH GFR
ALT: 10 U/L (ref 0–44)
AST: 19 U/L (ref 15–41)
Albumin: 1.9 g/dL — ABNORMAL LOW (ref 3.5–5.0)
Alkaline Phosphatase: 62 U/L (ref 38–126)
Anion gap: 9 (ref 5–15)
BUN: 5 mg/dL — ABNORMAL LOW (ref 6–20)
CO2: 22 mmol/L (ref 22–32)
Calcium: 8.3 mg/dL — ABNORMAL LOW (ref 8.9–10.3)
Chloride: 107 mmol/L (ref 98–111)
Creatinine, Ser: 0.73 mg/dL (ref 0.44–1.00)
GFR, Estimated: >60 mL/min (ref >60)
Glucose, Bld: 82 mg/dL (ref 70–99)
Potassium: 3.3 mmol/L — ABNORMAL LOW (ref 3.5–5.1)
Sodium: 138 mmol/L (ref 135–145)
Total Bilirubin: 0.4 mg/dL (ref 0.0–1.2)
Total Protein: 4.5 g/dL — ABNORMAL LOW (ref 6.5–8.1)

## 2023-07-29 LAB — SURGICAL PATHOLOGY

## 2023-07-29 MED ORDER — BENZOCAINE-MENTHOL 20-0.5 % EX AERO: 1.0000 | INHALATION_SPRAY | Freq: Four times a day (QID) | CUTANEOUS | Status: DC | PRN

## 2023-07-29 NOTE — Lactation Note (Signed)
 This note was copied from a baby's chart.  NICU Lactation Consultation Note  Patient Name: Shelly Mccann [id number] Date: 07/29/2023 Age:28 hours  Reason for consult: Primapara; 1st time breastfeeding; Follow-up assessment; Infant < 5lbs; NICU baby; Multiple gestation; Preterm <34wks; Other (Comment) (Maternal tobacco and THC use, LPNC) Type of Endocrine Disorder?: Diabetes (GDM)  SUBJECTIVE Visited with family of 60 6/45 weeks old AGA NICU twin female "Tatum"; Shelly Mccann reported she's been pumping and continues getting small amounts of colostrum, she was engaging in STS care when entered the room; praised her for her efforts. Noticed that pumping hasn't been consistent. Explained the importance of consistent pumping for the onset of lactogenesis II and to protect her supply. She had some questions regarding breastmilk storage guidelines and her insurance pump; addressed those as well as the benefits of premature milk.  OBJECTIVE Infant data: Mother's Current Feeding Choice: Breast Milk and Donor Milk  O2 Device: Room Air FiO2 (%): 21 %  Infant feeding assessment IDFTS - Readiness: 3   Maternal data: [id number] C-Section, Low Transverse Has patient been taught Hand Expression?: No Hand Expression Comments: declined on 07/28/2023 Significant Breast History:: (+) breast changes during the pregnancy Current breast feeding challenges:: NICU admission Does the patient have breastfeeding experience prior to this delivery?: No Pumping frequency: 3-4 times/24 hours Pumped volume: 2 mL (sometimes it's just drops) Flange Size: 21 Hands-free pumping top sizes: Large Shelly Mccann) Risk factor for low/delayed milk supply:: primipara, prematurity, infant separation, < 5 lbs  Pump: Received Stork Pump (Spectra  S2)  ASSESSMENT Infant: Feeding Status: Scheduled 9-12-3-6 Feeding method: Tube/Gavage (Bolus)  Maternal: Milk volume: Normal  INTERVENTIONS/PLAN Interventions: Interventions: Breast  feeding basics reviewed; Skin to skin; Coconut oil; DEBP; Education Tools: Pump; Flanges; Hands-free pumping top; Coconut oil Pump Education: Setup, frequency, and cleaning; Milk Storage  Plan: STS around care times Pump both breasts on initiate mode every 3 hours for 15 minutes; ideally 8 pumping sessions/24 hours   No other support person at this time. All questions and concerns answered, family to contact [facility] services PRN.  Consult Status: NICU follow-up NICU Follow-up type: Maternal D/C visit   Shelly Mccann Bare 07/29/2023, 1:07 PM

## 2023-07-29 NOTE — Lactation Note (Signed)
 This note was copied from a baby's chart.  NICU Lactation Consultation Note  Patient Name: Shelly Mccann [id number] Date: 07/29/2023 Age:28 hours  Reason for consult: Follow-up assessment; NICU baby; Multiple gestation; Infant < 5lbs; Preterm <34wks; Other (Comment); Primapara; 1st time breastfeeding (Maternal tobacco and THC use, LPNC) Type of Endocrine Disorder?: Diabetes (GDM)  SUBJECTIVE Visited with family of 28 20/22 weeks old AGA NICU twin female "[name]"; Ms. Samad reported she's been pumping and continues getting small amounts of colostrum, she was engaging in STS care when entered the room; praised her for her efforts. Noticed that pumping hasn't been consistent. Explained the importance of consistent pumping for the onset of lactogenesis II and to protect her supply. She had some questions regarding breastmilk storage guidelines and her insurance pump; addressed those as well as the benefits of premature milk.  OBJECTIVE Infant data: Mother's Current Feeding Choice: Breast Milk and Donor Milk  O2 Device: Room Air  Infant feeding assessment IDFTS - Readiness: 3   Maternal data: [id number] Vaginal, Spontaneous Has patient been taught Hand Expression?: No Hand Expression Comments: declined on 07/28/2023 Significant Breast History:: (+) breast changes during the pregnancy Current breast feeding challenges:: NICU admission Does the patient have breastfeeding experience prior to this delivery?: No Pumping frequency: 3-4 times/24 hours Pumped volume: 2 mL (sometimes it's just drops) Flange Size: 21 Hands-free pumping top sizes: Large Martina Sledge) Risk factor for low/delayed milk supply:: primipara, prematurity, infant separation, < 5 lbs  Pump: Received Stork Pump (Spectra  S2)  ASSESSMENT Infant: Feeding Status: Scheduled 8-11-2-5 Feeding method: Tube/Gavage (Bolus)  Maternal: Milk volume: Normal  INTERVENTIONS/PLAN Interventions: Interventions: Breast feeding basics  reviewed; Coconut oil; DEBP; Education; Skin to skin Tools: Pump; Flanges; Hands-free pumping top; Coconut oil Pump Education: Setup, frequency, and cleaning; Milk Storage  Plan: STS around care times Pump both breasts on initiate mode every 3 hours for 15 minutes; ideally 8 pumping sessions/24 hours   No other support person at this time. All questions and concerns answered, family to contact [facility] services PRN.  Consult Status: NICU follow-up NICU Follow-up type: Maternal D/C visit   Shelly Mccann 07/29/2023, 1:07 PM

## 2023-07-29 NOTE — Clinical Social Work Maternal (Addendum)
 CLINICAL SOCIAL WORK MATERNAL/CHILD NOTE  Patient Details  Name: Shelly Mccann MRN: [id number] Date of Birth: May 25, 1995  Date:  07/29/2023  Clinical Social Worker Initiating Note:  Nickolas Barr, [place] Date/Time: Initiated:  07/29/23/1420     Child's Name:  Girl A: Isadora Mar , Boy B: Irine Manning   Biological Parents:  Mother, Father (EXB:MWUXL Gruenhagen Oct 02, 1995, FOB: Leandra Pro 06/16/1996)   Need for Interpreter:  None   Reason for Referral:  Current Substance Use/Substance Use During Pregnancy  , Other (Comment) (NICU admission)   Address:  [address] [place] [address]    Phone number:  [phone] (home)     Additional phone number:   Household Members/Support Persons (HM/SP):   Household Member/Support Person 1   HM/SP Name Relationship DOB or Age  HM/SP -1 Leandra Pro Significant Other 06/16/1996  HM/SP -2        HM/SP -3        HM/SP -4        HM/SP -5        HM/SP -6        HM/SP -7        HM/SP -8          Natural Supports (not living in the home):      Professional Supports: None   Employment: Full-time   Type of Work: Environmental manager   Education:  Research scientist (physical sciences)   Homebound arranged:    Surveyor, quantity Resources:  Medicaid   Other Resources:  [facility]   Cultural/Religious Considerations Which May Impact Care:    Strengths:  Ability to meet basic needs  , Home prepared for child     Psychotropic Medications:         Pediatrician:       Pediatrician List:   Radiographer, therapeutic    [place]    [place]      Pediatrician Fax Number:    Risk Factors/Current Problems:  Substance Use     Cognitive State:  Able to Concentrate  , Goal Oriented  , Alert  , Insightful     Mood/Affect:  Calm  , Interested     CSW Assessment: CSW received consult for NICU admission and substance use during pregnancy. CSW met with MOB to offer support and complete  assessment.   CSW met with MOB at bedside and introduced CSW role. During the visit, MOB presented calm and engaged with CSW during the visit. CSW congratulated MOB on her baby boy and girl. CSW asked MOB if the demographic information on hospital file was correct. MOB confirmed that the information was correct. CSW inquired about MOB household. MOB stated that she lived with FOB and worked full time as a Copywriter, advertising.  CSW asked MOB if she had two other children (per H&P) MOB clarified that this is her first pregnancy. CSW asked if MOB received SNAP benefits. MOB reported that she received WIC and had applied for food stamp benefits about four days ago. MOB reported that she had all essential items to care for the infants including two car seats and cribs. CSW inquired about MOB support system. MOB identified FOB as her support.  CSW asked MOB if she had chosen pediatrician for the infant. MOB reported that she was still deciding. CSW agreed to provide MOB with a pediatrician list. CSW asked MOB how she had been doing since giving birth. MOB reported  that she was still processing the birth since she was not expecting her babies for another month. CSW asked MOB if the NICU staff kept her well informed regarding the infant's care. MOB reported that the NICU staff had kept her informed. CSW discussed the NICU admission and support services offered. CSW offered to check in with MOB to offer support while the infants remain in NICU. MOB was receptive to the visits.  CSW inquired if MOB had mental health history. MOB denied mental health history. CSW provided education regarding the baby blues period vs. perinatal mood disorders, discussed treatment and gave resources for mental health. CSW recommended MOB self-evaluation during the postpartum time period using the New Mom Checklist from Postpartum Progress and encouraged MOB to contact a medical professional if symptoms are noted at any time. MOB reported that  she understood. CSW assessed MOB for safety. MOB denied SI/HI and DV concerns.  CSW inquired about MOB substance use during the pregnancy. MOB stated that she used marijuana during the pregnancy to help with nausea and eating. MOB reported that she was advised by her OB provider to quit using. MOB stated she did not use marijuana as often as she had done prior to the pregnancy and continued to use throughout the pregnancy. CSW informed MOB about the hospital drug screen policy and that CSW will continue to monitor the infant's cord. MOB reported that she understood and requested CSW follow up with her regarding this. CSW agreed to follow up with MOB.  CSW assessed MOB for additional needs. MOB reported none.   CSW Plan/Description:  Hospital Drug Screen Policy Information, CSW Will Continue to Monitor Umbilical Cord Tissue Drug Screen Results and Make Report if Warranted, No Further Intervention Required/No Barriers to Discharge, Perinatal Mood and Anxiety Disorder (PMADs) Education    Rebecka Can, LCSW 07/29/2023, 3:11 PM

## 2023-07-29 NOTE — Progress Notes (Signed)
 Subjective: Postpartum Day 1: Vaginal delivery of twin A and Cesarean Delivery for twin B Patient reports feeling well. She has been ambulating, voiding and tolerating a regular diet. Incisional pain is well controlled    Objective: Vital signs in last 24 hours: Temp:  [98.4 F (36.9 C)-98.8 F (37.1 C)] 98.6 F (37 C) (04/23 0355) Pulse Rate:  [54-69] 67 (04/23 0355) Resp:  [16-18] 16 (04/23 0355) BP: (117-140)/(58-77) 117/61 (04/23 0355) SpO2:  [95 %-100 %] 98 % (04/23 0355)  Physical Exam:  General: alert, cooperative, and no distress Lochia: appropriate Uterine Fundus: firm Incision: honeycomb dressing stained with old blood in midline DVT Evaluation: No evidence of DVT seen on physical exam.  Recent Labs    07/28/23 0330 07/28/23 0713  HGB 10.0* 10.1*  HCT 28.7* 28.8*    Assessment/Plan: Status post Cesarean section. Doing well postoperatively.  BP well controlled on procardia  and lasix  Continue current care.  Verlyn Goad, MD 07/29/2023, 7:08 AM

## 2023-07-30 MED ORDER — IBUPROFEN 100 MG/5ML PO SUSP
ORAL | Status: AC
  Filled 2023-07-30: qty 10

## 2023-07-30 MED ORDER — FUROSEMIDE 20 MG PO TABS
20.0000 mg | ORAL_TABLET | Freq: Every day | ORAL | Status: DC
  Administered 2023-07-30 – 2023-07-31 (×2): 20 mg via ORAL
  Filled 2023-07-30 (×2): qty 1

## 2023-07-30 MED ORDER — POTASSIUM CHLORIDE CRYS ER 20 MEQ PO TBCR
20.0000 meq | EXTENDED_RELEASE_TABLET | Freq: Every day | ORAL | Status: DC
  Administered 2023-07-30 – 2023-07-31 (×2): 20 meq via ORAL
  Filled 2023-07-30 (×2): qty 1

## 2023-07-30 NOTE — Progress Notes (Signed)
 POSTPARTUM PROGRESS NOTE  POD #2  Subjective:  Shelly Mccann is a 28 y.o. [phone] s/p primary LTCS at [redacted]w[redacted]d for malpresentation of baby B.  No acute events overnight. She reports she is doing well. She denies any problems with ambulating, voiding or po intake. Denies nausea or vomiting. She has  passed flatus. Pain is well controlled.  Lochia is normal.  Objective: Blood pressure 130/68, pulse (!) 58, temperature 98.4 F (36.9 C), temperature source Oral, resp. rate 16, height 5\' 9"  (1.753 m), weight 112 kg, last menstrual period 12/10/2022, SpO2 100%, unknown if currently breastfeeding.  Physical Exam:  General: alert, cooperative and no distress Chest: clear to auscultation bilaterally Heart:regular rate and rhythm Abdomen: soft, nontender, nondistended, positive bowel sounds Uterine Fundus: firm, appropriately tender DVT Evaluation: No calf swelling or tenderness Extremities: 3+ edema Skin: warm, dry; incision clean/dry/intact w/ honeycomb dressing in place  Recent Labs    07/28/23 0330 07/28/23 0713  HGB 10.0* 10.1*  HCT 28.7* 28.8*    Assessment/Plan: Shelly Mccann is a 28 y.o. [id number] s/p primary LTCS at [redacted]w[redacted]d for malpresentation of Baby B.  POD#2 -  Contraception: unknown Feeding: breast  Dispo:anticipate d/c in the AM   LOS: 2 days   Avie Boeck, Md Faculty Attending, Center for [employer] 07/30/2023, 3:07 PM

## 2023-07-30 NOTE — Plan of Care (Signed)
 Patient progressing with plan of care. Ambulating back and forth to NICU. Successful with Breastpumping and getting a lot of breastmilk for the baby.

## 2023-07-30 NOTE — Plan of Care (Signed)
  Problem: Education: Goal: Knowledge of General Education information will improve Description: Including pain rating scale, medication(s)/side effects and non-pharmacologic comfort measures Outcome: Progressing   Problem: Health Behavior/Discharge Planning: Goal: Ability to manage health-related needs will improve Outcome: Progressing   Problem: Clinical Measurements: Goal: Ability to maintain clinical measurements within normal limits will improve Outcome: Progressing Goal: Will remain free from infection Outcome: Progressing   Problem: Activity: Goal: Risk for activity intolerance will decrease Outcome: Progressing   Problem: Nutrition: Goal: Adequate nutrition will be maintained Outcome: Progressing   Problem: Coping: Goal: Level of anxiety will decrease Outcome: Progressing   Problem: Elimination: Goal: Will not experience complications related to bowel motility Outcome: Progressing Goal: Will not experience complications related to urinary retention Outcome: Progressing   Problem: Pain Managment: Goal: General experience of comfort will improve and/or be controlled Outcome: Progressing   Problem: Skin Integrity: Goal: Demonstration of wound healing without infection will improve Outcome: Progressing   Problem: Education: Goal: Knowledge of condition will improve Outcome: Progressing   Problem: Activity: Goal: Will verbalize the importance of balancing activity with adequate rest periods Outcome: Progressing

## 2023-07-31 ENCOUNTER — Other Ambulatory Visit

## 2023-07-31 ENCOUNTER — Other Ambulatory Visit (HOSPITAL_COMMUNITY): Payer: Self-pay

## 2023-07-31 MED ORDER — NIFEDIPINE ER 30 MG PO TB24
30.0000 mg | ORAL_TABLET | Freq: Every day | ORAL | 1 refills | Status: AC
  Filled 2023-07-31: qty 30, 30d supply, fill #0

## 2023-07-31 MED ORDER — IBUPROFEN 600 MG PO TABS
600.0000 mg | ORAL_TABLET | Freq: Four times a day (QID) | ORAL | 0 refills | Status: AC
  Filled 2023-07-31: qty 30, 8d supply, fill #0

## 2023-07-31 MED ORDER — OXYCODONE HCL 5 MG PO TABS
5.0000 mg | ORAL_TABLET | ORAL | 0 refills | Status: AC | PRN
Start: 2023-07-31 — End: ?
  Filled 2023-07-31: qty 30, 3d supply, fill #0

## 2023-07-31 MED ORDER — POTASSIUM CHLORIDE CRYS ER 20 MEQ PO TBCR
20.0000 meq | EXTENDED_RELEASE_TABLET | Freq: Every day | ORAL | 0 refills | Status: AC
  Filled 2023-07-31: qty 5, 5d supply, fill #0

## 2023-07-31 MED ORDER — FUROSEMIDE 20 MG PO TABS
20.0000 mg | ORAL_TABLET | Freq: Every day | ORAL | 0 refills | Status: AC
  Filled 2023-07-31: qty 5, 5d supply, fill #0

## 2023-07-31 NOTE — Lactation Note (Addendum)
 This note was copied from a baby's chart.  NICU Lactation Consultation Note  Patient Name: Shelly Mccann [id number] Date: 07/31/2023 Age:28 days  Reason for consult: Follow-up assessment; Primapara; 1st time breastfeeding; NICU baby; Preterm <34wks; Infant < 5lbs; Multiple gestation; Maternal endocrine disorder Type of Endocrine Disorder?: Diabetes THC and tobacco use  SUBJECTIVE  LC in to visit with P1 Mom of preterm twins "Shelly Mccann" and "Shelly Mccann" in the NICU.  Both babies are on room air and being gavage fed DBM.  Mom pumped this morning after sleeping 6-8 hrs and expressed 240 ml.  Mom states she has been consistently pumping, but just slept all last night.  Breasts are heavy, but not engorged.  Engorgement prevention and treatment reviewed.  Encouraged Mom not to go longer than 4-5 hrs without pumping now.  Mom provided with another hands free pumping band due to too large holes cut into band.  Mom had questions regarding her vaping.  Mom states it is nicotine only and worries about the affect on the preterm babies.  LC encouraged Mom to quit vaping and definitely never to do it around the babies when they are home due to increase incidence of SIDS.  Mom encouraged to vape only after pumping and to wait 3 hrs to express the next session.   LC shared that the benefits of breast milk for her babies is most important.   OBJECTIVE Infant data: No data recorded O2 Device: Room Air  Infant feeding assessment IDFTS - Readiness: 3   Maternal data: [id number] Vaginal, Spontaneous Pumping frequency: 6-8 times per 24 hrs Pumped volume: 150 mL (150-240 ml) Flange Size: 21 Hands-free pumping top sizes: Large Shelly Mccann)  Pump: Received Stork Pump  ASSESSMENT Infant:  Feeding Status: Scheduled 8-11-2-5 Feeding method: Tube/Gavage (Bolus)  Maternal: Milk volume: Normal  INTERVENTIONS/PLAN Interventions: Interventions: Breast feeding basics reviewed; Skin to skin; Breast massage; Hand  express; DEBP; 8 oz. bottles; Education Discharge Education: Engorgement and breast care Tools: Pump; Flanges; 8 oz. bottles; Hands-free pumping top Pump Education: Setup, frequency, and cleaning; Milk Storage  Plan: 1- STS with babies 2- Massage breasts and hand express  3- Pump both breasts on maintain mode for 20-30 mins 8 times per 24 hrs. 4- ask for LC support prn  Consult Status: NICU follow-up NICU Follow-up type: Maternal D/C visit   Shelly Mccann 07/31/2023, 1:11 PM

## 2023-08-04 ENCOUNTER — Other Ambulatory Visit: Payer: Self-pay

## 2023-08-04 ENCOUNTER — Ambulatory Visit: Admitting: Obstetrics & Gynecology

## 2023-08-04 ENCOUNTER — Encounter: Payer: Self-pay | Admitting: Obstetrics & Gynecology

## 2023-08-04 ENCOUNTER — Other Ambulatory Visit

## 2023-08-04 ENCOUNTER — Other Ambulatory Visit: Payer: Self-pay | Admitting: Obstetrics and Gynecology

## 2023-08-04 VITALS — BP 134/87 | HR 76

## 2023-08-04 DIAGNOSIS — Z9889 Other specified postprocedural states: Secondary | ICD-10-CM

## 2023-08-04 NOTE — Progress Notes (Signed)
  HPI: Patient returns for routine postoperative follow-up having undergone vaginal delivery of A and low transverse Caesarean section on 07/28/23. The patient's immediate postoperative recovery has been unremarkable. Since hospital discharge the patient reports no problems.   Current Outpatient Medications: furosemide  (LASIX ) 20 MG tablet, Take 1 tablet (20 mg total) by mouth daily., Disp: 5 tablet, Rfl: 0 ibuprofen  (ADVIL ) 600 MG tablet, Take 1 tablet (600 mg total) by mouth every 6 (six) hours., Disp: 30 tablet, Rfl: 0 NIFEdipine  (ADALAT  CC) 30 MG 24 hr tablet, Take 1 tablet (30 mg total) by mouth daily., Disp: 30 tablet, Rfl: 1 potassium chloride  SA (KLOR-CON  M) 20 MEQ tablet, Take 1 tablet (20 mEq total) by mouth daily., Disp: 5 tablet, Rfl: 0 oxyCODONE  (OXY IR/ROXICODONE ) 5 MG immediate release tablet, Take 1-2 tablets (5-10 mg total) by mouth every 4 (four) hours as needed for moderate pain (pain score 4-6). (Patient not taking: Reported on 08/04/2023), Disp: 30 tablet, Rfl: 0  No current facility-administered medications for this visit.    Blood pressure (!) 138/92, pulse 84, last menstrual period 12/10/2022, unknown if currently breastfeeding.  Physical Exam: Incision clean dry intact Abdomen is benign  Diagnostic Tests:   Pathology:   Impression + Management plan: S/P LTCS after vaginal delivery   ICD-10-CM   1. Post-operative state: pLTCS for twin B after NSVD of twin A  Z98.890           Medications Prescribed this encounter: No orders of the defined types were placed in this encounter.     Follow up: No follow-ups on file.    Wendelyn Halter, MD Attending Physician for the Center for [place] [facility] 08/04/2023 11:43 AM

## 2023-08-07 ENCOUNTER — Encounter: Admitting: Obstetrics & Gynecology

## 2023-08-07 ENCOUNTER — Other Ambulatory Visit

## 2023-08-08 NOTE — ED Notes (Signed)
 Pt aware her ID was located here and it will be at registration for her to pick up. 08/08/2023 1500hrs

## 2023-08-10 ENCOUNTER — Other Ambulatory Visit: Payer: Self-pay | Admitting: Obstetrics & Gynecology

## 2023-08-10 ENCOUNTER — Other Ambulatory Visit

## 2023-08-10 DIAGNOSIS — F1290 Cannabis use, unspecified, uncomplicated: Secondary | ICD-10-CM

## 2023-08-13 ENCOUNTER — Encounter: Admitting: Obstetrics & Gynecology

## 2023-08-13 ENCOUNTER — Other Ambulatory Visit

## 2023-08-13 LAB — TOXASSURE SELECT 13 (MW), URINE

## 2023-08-15 ENCOUNTER — Ambulatory Visit (HOSPITAL_COMMUNITY): Payer: Self-pay

## 2023-08-15 NOTE — Lactation Note (Signed)
 This note was copied from a baby's chart.  NICU Lactation Consultation Note  Patient Name: Shelly Mccann [id number] Date: 08/15/2023 Age:28 wk.o.  Reason for consult: Weekly NICU follow-up; NICU baby; Multiple gestation; Primapara; 1st time breastfeeding; Late-preterm 34-36.6wks; Maternal endocrine disorder; Other (Comment); Infant < 5lbs (Babies' cord (+) for benzoylecgonine, cocaine and THC. Maternal tobacco and THC use, LPNC) Type of Endocrine Disorder?: Diabetes (GDM)  SUBJECTIVE Visited with family of 28 80/43 weeks old AGA NICU twin female "Shelly Mccann". Resuming using mother's milk again today after providing 2 consecutive negative maternal toxicology screens (except for THC) on 4/22 and 5/5. Ms. Sanderson reported she continues pumping consistently and her supply remains strong, praised her for all her efforts. She feels comfortable taking babies to breast on her own. This LC showed Ms. Hedding how to do the stopwatch while assisting with the other twin's feeding. Revised IDF 1/2 and anticipatory guidelines; and asked her to call for latch assistance whenever is needed.  OBJECTIVE Infant data: Mother's Current Feeding Choice: Breast Milk  O2 Device: Room Air  Infant feeding assessment IDFTS - Readiness: 3   Maternal data: [id number] Vaginal, Spontaneous Pumping frequency: 6-8 times/24 hours Pumped volume: 120 mL (120-180 ml)  Pump: Received Stork Pump (Spectra  S2)  ASSESSMENT Infant: Feeding Status: Scheduled 8-11-2-5 Feeding method: Tube/Gavage (Bolus)  Maternal: Milk volume: Normal  INTERVENTIONS/PLAN Interventions: Interventions: Breast feeding basics reviewed; DEBP; Education; Visual merchandiser education Discharge Education: Outpatient recommendation  Plan: STS around care times Pump both breasts on maintain mode every 3 hours for 20-30 minutes; ideally 8 pumping sessions/24 hours Continue taking baby "Shelly Mccann" to a pumped breast on feeding cues around  feeding times and call for assistance PRN   No other support person at this time. All questions and concerns answered, family to contact [facility] services PRN.  Consult Status: NICU follow-up NICU Follow-up type: Weekly NICU follow up; Assist with IDF-1 (Mother to pre-pump before breastfeeding)   Shelly Mccann 08/15/2023, 4:14 PM

## 2023-08-15 NOTE — Lactation Note (Signed)
 This note was copied from a baby's chart.  NICU Lactation Consultation Note  Patient Name: Shelly Mccann [id number] Date: 08/15/2023 Age:28 wk.o.  Reason for consult: Weekly NICU follow-up; Primapara; 1st time breastfeeding; NICU baby; Multiple gestation; Late-preterm 34-36.6wks; Maternal endocrine disorder; Infant < 6lbs; Other (Comment); Mother's request; RN request; Breastfeeding assistance (Babies' cord (+) for benzoylecgonine, cocaine and THC. Maternal tobacco and THC use, LPNC) Type of Endocrine Disorder?: Diabetes (GDM)  SUBJECTIVE Visited with family of 76 64/33 weeks old AGA NICU twin female "Shelly Mccann"; NICU RN Shelly Mccann requested assistance with the 3 pm feeding. Resuming using mother's milk again today after providing 2 consecutive negative maternal toxicology screens (except for THC) on 4/22 and 5/5. Baby "Shelly Mccann" just latched upon entering the room and vigorously sucking with a deep areolar latch at the R side in cross cradle hold. This LC showed Shelly Mccann how to do the stopwatch, baby kept sucking at the breast with long jaw excursions and multiple audible swallows (see LATCH score) at the same pace for the first three minutes, but then started to fatigue and taking long breaks; she fell asleep after 4 minutes and 22 seconds. He did a great job at pacing himself, no adverse events to report during this feeding. NICU RN already doing a full gavage for this feeding. Shelly Mccann reported she continues pumping consistently and her supply remains strong, praised her for all her efforts. She feels comfortable taking babies to breast on her own, just like she started this feeding. Revised IDF 1/2 and anticipatory guidelines.   OBJECTIVE Infant data: Mother's Current Feeding Choice: Breast Milk  O2 Device: Room Air  Infant feeding assessment IDFTS - Readiness: 2 IDFTS - Quality: 3   Maternal data: Shelly Mccann C-Section, Low Transverse Pumping frequency: 6-8 times/24 hours Pumped volume: 120 mL  (120-180 ml)  Pump: Received Stork Pump (Spectra  S2)  ASSESSMENT Infant: Latch: Grasps breast easily, tongue down, lips flanged, rhythmical sucking. Audible Swallowing: Spontaneous and intermittent Type of Nipple: Everted at rest and after stimulation Comfort (Breast/Nipple): Soft / non-tender Hold (Positioning): Assistance needed to correctly position infant at breast and maintain latch. (minimal assistance needed) LATCH Score: 9  Feeding Status: Scheduled 9-12-3-6 Feeding method: Breast; Tube/Gavage (Bolus) Nipple Type: Dr. Leticia Raven Preemie  Maternal: Milk volume: Normal  INTERVENTIONS/PLAN Interventions: Interventions: Breast feeding basics reviewed; Assisted with latch; Breast compression; Adjust position; Support pillows; DEBP; Education; Infant Driven Feeding Algorithm education  Plan: STS around care times Pump both breasts on maintain mode every 3 hours for 20-30 minutes; ideally 8 pumping sessions/24 hours Continue taking baby "Shelly Mccann" to a full breast on feeding cues around feeding times and call for assistance PRN Family will continue advancing on bottle feedings   No other support person at this time. All questions and concerns answered, family to contact [facility] services PRN.   Consult Status: NICU follow-up NICU Follow-up type: Weekly NICU follow up; Assist with IDF-2 (Mother does not need to pre-pump before breastfeeding)   Shelly Mccann 08/15/2023, 3:54 PM

## 2023-08-17 ENCOUNTER — Other Ambulatory Visit

## 2023-08-20 ENCOUNTER — Other Ambulatory Visit

## 2023-08-20 ENCOUNTER — Encounter: Admitting: Obstetrics & Gynecology

## 2023-08-25 ENCOUNTER — Encounter: Admitting: Obstetrics & Gynecology

## 2023-08-25 ENCOUNTER — Other Ambulatory Visit: Admitting: Radiology

## 2023-08-28 ENCOUNTER — Other Ambulatory Visit

## 2023-08-30 ENCOUNTER — Ambulatory Visit (HOSPITAL_COMMUNITY): Payer: Self-pay

## 2023-08-30 NOTE — Lactation Note (Signed)
 This note was copied from a baby's chart. Lactation Consultation Note  Patient Name: Shelly Mccann [id number] Date: 08/30/2023 Age:28 wk.o.   RN asked LC to call Mom regarding her concern about her milk supply.  Mom is back working from home and hasn't been able to pump more than 4 times per 24 hrs.  Mom is expressing on average 90 ml per session.  Tips given- 1- ask for 15-20 min breaks at work to increase pumping frequency to 6-8 times/24 hrs 2- "Power Pumping" reviewed and encouraged once per day 3- Warm moist compresses on breasts with gentle massage before and during pumping. 4- ask for LC next time Mom in with babies for an in person consult  Dario Edison 08/30/2023, 9:32 AM

## 2023-08-31 ENCOUNTER — Ambulatory Visit (HOSPITAL_COMMUNITY): Payer: Self-pay

## 2023-08-31 NOTE — Lactation Note (Signed)
 This note was copied from a baby's chart.  NICU Lactation Consultation Note  Patient Name: Shelly Mccann [id number] Date: 08/31/2023 Age:28 wk.o.  Reason for consult: Primapara; 1st time breastfeeding; NICU baby; Multiple gestation; Weekly NICU follow-up; Infant < 6lbs; Other (Comment); Maternal endocrine disorder; Early term 55-38.6wks (Babies' cord (+) for benzoylecgonine, cocaine and THC. Maternal tobacco and THC use, LPNC) Type of Endocrine Disorder?: Diabetes (GDM)  SUBJECTIVE Visited with family of 15 28/78 weeks old AGA NICU female "Shelly Mccann"; Ms. Lem reported she's not pumping as often as she used to since she started working a week ago, her supply has decreased. She noted that this is not her regular job but a temporary one that she picked up while she's on her 12 weeks of unpaid FMLA. Her current job is 5 hour shift with a 12 minutes break that doesn't allow her enough time to pump in during her break. Discussed options on how to overcome this obstacle, and explained that pumping also needs to be consistent and often when she's not working (day and night) in order to increase/protect her supply. Reviewed breastmilk storage guidelines for fresh, frozen and when fortifying breastmilk. She's aware that it will take about 5-7 days to see the results of these interventions.   OBJECTIVE Infant data: Mother's Current Feeding Choice: Breast Milk and Formula  O2 Device: Room Air  Infant feeding assessment IDFTS - Readiness: 1 IDFTS - Quality: 3   Maternal data: [id number] Vaginal, Spontaneous Pumping frequency: 4 times/24 hours Pumped volume: 90 mL  Pump: Received Stork Pump (Spectra  S2)  ASSESSMENT Infant: Feeding Status: Ad lib Feeding method: Bottle; Tube/Gavage (Bolus) Nipple Type: Dr. Leticia Raven Preemie  Maternal: Milk volume: Low  INTERVENTIONS/PLAN Interventions: Interventions: Breast feeding basics reviewed; DEBP; Education  Plan: STS around care times Pump both  breasts on maintain mode every 3 hours for 20-30 minutes; ideally 8 pumping sessions/24 hours Power pumping right before leaving for work and then again right after coming home She'll consider purchasing a wearable pump Continue taking baby "Shelly Mccann" to a full breast on feeding cues around feeding times and call for assistance PRN   No other support person at this time. All questions and concerns answered, family to contact [facility] services PRN.  Consult Status: NICU follow-up NICU Follow-up type: Weekly NICU follow up   Belynda Pagaduan S Jaymian Bogart 08/31/2023, 11:12 AM

## 2023-08-31 NOTE — Lactation Note (Addendum)
 This note was copied from a baby's chart.  NICU Lactation Consultation Note  Patient Name: Shelly Mccann [id number] Date: 08/31/2023 Age:28 wk.o.  Reason for consult: Weekly NICU follow-up; NICU baby; Multiple gestation; Primapara; 1st time breastfeeding; Early term 68-38.6wks; Maternal endocrine disorder; Other (Comment) (Babies' cord (+) for benzoylecgonine, cocaine and THC. Maternal tobacco and THC use, LPNC) Type of Endocrine Disorder?: Diabetes (GDM)  SUBJECTIVE Visited with family of 83 98/37 weeks old AGA NICU female "Shelly Mccann"; Ms. Shiel reported she's not pumping as often as she used to since she started working a week ago, her supply has decreased. She noted that this is not her regular job but a temporary one that she picked up while she's on her 12 weeks of unpaid FMLA. Her current job is 5 hour shift with a 12 minutes break that doesn't allow her enough time to pump in during her break. Discussed options on how to overcome this obstacle. Revised breastmilk storage guidelines for fresh, frozen and when fortifying breastmilk. She's aware that it will take about 5-7 days to see the results of these interventions.   Ms. Baynes is taking baby "Shelly Mccann" home today. Reviewed discharge education and the importance of consistent pumping when she's not working (day and night) in order to increase/protect her supply. She politely declined a referral to [facility] OP but has their contact info in case she needs to reach out. Encouraged to pump whenever "Shelly Mccann" is getting a bottle and to power pump before/after her shifts, she'll also consider purchasing a wearable pump. No other support person at this time. All questions and concerns answered, family to contact [facility] services PRN.  OBJECTIVE Infant data: Mother's Current Feeding Choice: Breast Milk and Formula  O2 Device: Room Air  Infant feeding assessment IDFTS - Readiness: 1 IDFTS - Quality: 3   Maternal data: [id number] C-Section, Low Transverse Pumping  frequency: 4 times/24 hours Pumped volume: 90 mL  Pump: Received Stork Pump (Spectra  S2)  ASSESSMENT Infant: Feeding Status: Ad lib Feeding method: Bottle Nipple Type: Dr. Michelene Ahmadi Preemie  Maternal: Milk volume: Low  INTERVENTIONS/PLAN Interventions: Interventions: Breast feeding basics reviewed; DEBP; Education Discharge Education: Outpatient recommendation  Plan: Consult Status: Complete   Kaia Depaolis S Eliah Marquard 08/31/2023, 11:21 AM

## 2023-09-01 ENCOUNTER — Encounter: Admitting: Obstetrics & Gynecology

## 2023-09-01 ENCOUNTER — Other Ambulatory Visit

## 2023-09-03 ENCOUNTER — Encounter: Payer: Self-pay | Admitting: Advanced Practice Midwife

## 2023-09-03 ENCOUNTER — Ambulatory Visit (INDEPENDENT_AMBULATORY_CARE_PROVIDER_SITE_OTHER): Admitting: Advanced Practice Midwife

## 2023-09-03 ENCOUNTER — Other Ambulatory Visit

## 2023-09-03 VITALS — BP 127/82 | HR 74 | Ht 69.0 in | Wt 187.0 lb

## 2023-09-03 DIAGNOSIS — O24410 Gestational diabetes mellitus in pregnancy, diet controlled: Secondary | ICD-10-CM

## 2023-09-03 DIAGNOSIS — O30003 Twin pregnancy, unspecified number of placenta and unspecified number of amniotic sacs, third trimester: Secondary | ICD-10-CM

## 2023-09-03 DIAGNOSIS — Z8632 Personal history of gestational diabetes: Secondary | ICD-10-CM

## 2023-09-03 DIAGNOSIS — Z3201 Encounter for pregnancy test, result positive: Secondary | ICD-10-CM | POA: Diagnosis not present

## 2023-09-03 DIAGNOSIS — O30049 Twin pregnancy, dichorionic/diamniotic, unspecified trimester: Secondary | ICD-10-CM

## 2023-09-03 DIAGNOSIS — O0990 Supervision of high risk pregnancy, unspecified, unspecified trimester: Secondary | ICD-10-CM

## 2023-09-03 LAB — POCT URINE PREGNANCY: Preg Test, Ur: POSITIVE — AB

## 2023-09-03 NOTE — Progress Notes (Signed)
 Post Partum Visit Note   Chief Complaint:   Postpartum Care  History of Present Illness:   Shelly Mccann is a 28 y.o. [phone] female being seen today for a postpartum visit. She is 5 weeks postpartum following a SVD of twin A and CS of twin B (arm delivered, declined rotation attempt) at 32.6 gestational weeks. IOL: No, . Anesthesia: spinal.  Laceration: none.  Complications: none. Inpatient contraception: no.  Son came home this week and daughter is doing well, just working on feeding.   Pregnancy uncomplicated. Tobacco use: no. Substance use disorder: no. Last pap smear:     Component Value Date/Time   DIAGPAP  08/13/2020 1558    - Negative for intraepithelial lesion or malignancy (NILM)   ADEQPAP  08/13/2020 1558    Satisfactory for evaluation; transformation zone component PRESENT.     Next pap smear due: now; declines today, wants to get w/IUD insertion  Patient's last menstrual period was 08/24/2023.  Postpartum course has been uncomplicated. Bleeding since delivery, felt like it picked up a lot on 5/19 and may have been a period. Bowel function is normal. Bladder function is normal. Urinary incontinence? No, fecal incontinence? No Patient is sexually active. Last sexual activity: prior to 5/19 .    Upstream - 09/03/23 0958       Pregnancy Intention Screening   Does the patient want to become pregnant in the next year? No    Does the patient's partner want to become pregnant in the next year? No    Would the patient like to discuss contraceptive options today? Yes      Contraception Wrap Up   Current Method IUD or IUS    End Method IUD or IUS    Contraception Counseling Provided Yes            The pregnancy intention screening data noted above was reviewed. Potential methods of contraception were discussed. The patient elected to proceed with IUD or IUS.  Edinburgh Postpartum Depression Screening: Negative  Edinburgh Postnatal Depression Scale - 09/03/23 0948        Edinburgh Postnatal Depression Scale:  In the Past 7 Days   I have been able to laugh and see the funny side of things. 0    I have looked forward with enjoyment to things. 0    I have blamed myself unnecessarily when things went wrong. 3    I have been anxious or worried for no good reason. 0    I have felt scared or panicky for no good reason. 0    Things have been getting on top of me. 1    I have been so unhappy that I have had difficulty sleeping. 0    I have felt sad or miserable. 0    I have been so unhappy that I have been crying. 0    The thought of harming myself has occurred to me. 0    Edinburgh Postnatal Depression Scale Total 4            Babies' course has been complicated by prematurity. Baby is feeding by breast and formula.  Latches son am and pm.  Milk supply dwindling but OK with that.  Pumps when she can, but not that often d/t work. Hillary Lowing has a pediatrician/family doctor? Yes.  Childcare strategy if returning to work/school: yes.  Pt has material needs met for her and baby: Yes.   Review of Systems:   Pertinent items are noted in  HPI Denies Abnormal vaginal discharge w/ itching/odor/irritation, headaches, visual changes, shortness of breath, chest pain, abdominal pain, severe nausea/vomiting, or problems with urination or bowel movements. Pertinent History Reviewed:  Reviewed past medical,surgical, obstetrical and family history.  Reviewed problem list, medications and allergies. OB History  Gravida Para Term Preterm AB Living  4 1  1 3 2   SAB IAB Ectopic Multiple Live Births  1 2  1 2     # Outcome Date GA Lbr Len/2nd Weight Sex Type Anes PTL Lv  4A Preterm 07/28/23 [redacted]w[redacted]d  3 lb 4.2 oz (1.48 kg) F Vag-Spont None  LIV     Birth Comments: WNL  4B Preterm 07/28/23 [redacted]w[redacted]d  4 lb 6.9 oz (2.01 kg) M CS-LTranv None  LIV  3 IAB           2 IAB           1 SAB            Physical Assessment:   Vitals:   09/03/23 0941  BP: 127/82  Pulse: 74  Weight:  187 lb (84.8 kg)  Height: 5\' 9"  (1.753 m)  Body mass index is 27.62 kg/m.  Objective:  Blood pressure 127/82, pulse 74, height 5\' 9"  (1.753 m), weight 187 lb (84.8 kg), last menstrual period 08/24/2023, currently breastfeeding.  General:  alert, cooperative, and no distress   Breasts:  negative  Lungs: Normal respiratory effort  Heart:  regular rate and rhythm  Abdomen: soft, non-tender, incision well healed   Vulva:  not evaluated  Vagina: not evaluated  Cervix:  normal  Corpus: Well involuted  Adnexa:  not evaluated  Rectal Exam: no hemorrhoids          Results for orders placed or performed in visit on 09/03/23 (from the past 24 hours)  POCT urine pregnancy   Collection Time: 09/03/23 10:29 AM  Result Value Ref Range   Preg Test, Ur Positive (A) Negative    Assessment & Plan:  1) Postpartum exam 2) 5 wks s/p SVD and CS for malpresentation of Baby B 3) breast & bottle feeding 4) Depression screening 5) Contraception counseling  Essential components of care per ACOG recommendations:  1.  Mood and well being:  If positive depression screen, discussed and plan developed.  If using tobacco we discussed reduction/cessation and risk of relapse If current substance abuse, we discussed and referral to local resources was offered.   2. Infant care and feeding:  If breastfeeding, discussed returning to work, pumping, breastfeeding-associated pain, guidance regarding return to fertility while lactating if not using another method. If needed, patient was provided with a letter to be allowed to pump q 2-3hrs to support lactation in a private location with access to a refrigerator to store breastmilk.   Recommended that all caregivers be immunized for flu, pertussis and other preventable communicable diseases If pt does not have material needs met for her/baby, referred to local resources for help obtaining these.  3. Sexuality, contraception and birth spacing Provided guidance  regarding sexuality, management of dyspareunia, and resumption of intercourse Discussed avoiding interpregnancy interval <58mths and recommended birth spacing of 18 months UPT + X2:  hopefully just left over hormones from pregnancy, but since had IC, will check Qhcg.  If >5, repeat Monday; if declining, can go ahead w/IUD next week.  No IC until IUD  4. Sleep and fatigue Discussed coping options for fatigue and sleep disruption Encouraged family/partner/community support of 4 hrs of uninterrupted sleep to help with  mood and fatigue  5. Physical recovery  If pt had a C/S, assessed incisional pain and providing guidance on normal vs prolonged recovery If pt had a laceration, perineal healing and pain reviewed.  If urinary or fecal incontinence, discussed management and referred to PT or uro/gyn if indicated  Patient is safe to resume physical activity. Discussed attainment of healthy weight.  6.  Chronic disease management Discussed pregnancy complications if any, and their implications for future childbearing and long-term maternal health. Review recommendations for prevention of recurrent pregnancy complications, such as aspirin  to reduce risk of preeclampsia not applicable. Pt had GDM: No. If yes, 2hr GTT scheduled: not applicable. Reviewed medications and non-pregnant dosing including consideration of whether pt is breastfeeding using a reliable resource such as LactMed: yes Referred for f/u w/ PCP or subspecialist providers as indicated: not applicable  7. Health maintenance Mammogram at 28yo or earlier if indicated Pap smears as indicated  Meds: No orders of the defined types were placed in this encounter.   Follow-up: Return in about 1 week (around 09/10/2023) for IUD/pap.   Orders Placed This Encounter  Procedures   B-HCG Quant   Beta hCG quant (ref lab)   Beta hCG quant (ref lab)   POCT urine pregnancy       Majel Scott DNP, CNM [facility], [facility] 09/03/2023 10:49 AM

## 2023-09-04 ENCOUNTER — Ambulatory Visit: Payer: Self-pay | Admitting: Advanced Practice Midwife

## 2023-09-04 ENCOUNTER — Other Ambulatory Visit

## 2023-09-04 LAB — BETA HCG QUANT (REF LAB): hCG Quant: 4 m[IU]/mL

## 2023-09-07 ENCOUNTER — Encounter: Admitting: Women's Health

## 2023-09-07 ENCOUNTER — Other Ambulatory Visit

## 2023-09-10 ENCOUNTER — Telehealth: Payer: Self-pay | Admitting: Adult Health

## 2023-09-10 ENCOUNTER — Ambulatory Visit: Admitting: Adult Health

## 2023-09-10 ENCOUNTER — Other Ambulatory Visit

## 2023-09-10 MED ORDER — LO LOESTRIN FE 1 MG-10 MCG / 10 MCG PO TABS: 1.0000 | ORAL_TABLET | Freq: Every day | ORAL | 1 refills | Status: AC

## 2023-09-10 NOTE — Telephone Encounter (Signed)
 Pt wants to start birth control pills. Last sex was the day she gave birth(4/22); that's what started labor. LMP was around 5/19. No history of heart attack, stroke, pulmonary embolism, migraine with aura or breast cancer. Thanks! JSY

## 2023-09-10 NOTE — Telephone Encounter (Signed)
 Patient called and states that she is unable to come to her appt today and reschedule for August but is asking if can have the b/c pill sent to [facility] on [address] since she is not getting iud today.

## 2023-09-10 NOTE — Telephone Encounter (Signed)
 She is not breastfeeding, will rx lo Loestrin, can start now, use condoms for 1 month, has IUD insertion scheduled in August.

## 2023-09-10 NOTE — Telephone Encounter (Signed)
Left message @ 10:15 am. JSY

## 2023-09-10 NOTE — Addendum Note (Signed)
 Addended by: Jacksyn Beeks A on: 09/10/2023 11:39 AM   Modules accepted: Orders

## 2023-09-14 ENCOUNTER — Encounter: Admitting: Obstetrics & Gynecology

## 2023-09-14 ENCOUNTER — Other Ambulatory Visit

## 2023-11-10 ENCOUNTER — Ambulatory Visit: Admitting: Adult Health

## 2023-12-25 ENCOUNTER — Encounter: Payer: Self-pay | Admitting: Emergency Medicine

## 2023-12-25 ENCOUNTER — Ambulatory Visit
Admission: EM | Admit: 2023-12-25 | Discharge: 2023-12-25 | Disposition: A | Discharge status: Home / Self Care | Attending: Emergency Medicine | Admitting: Emergency Medicine

## 2023-12-25 DIAGNOSIS — N898 Other specified noninflammatory disorders of vagina: Secondary | ICD-10-CM | POA: Insufficient documentation

## 2023-12-25 DIAGNOSIS — Z3202 Encounter for pregnancy test, result negative: Secondary | ICD-10-CM | POA: Diagnosis present

## 2023-12-25 DIAGNOSIS — Z113 Encounter for screening for infections with a predominantly sexual mode of transmission: Secondary | ICD-10-CM | POA: Insufficient documentation

## 2023-12-25 LAB — POCT URINE DIPSTICK
Bilirubin, UA: NEGATIVE
Blood, UA: NEGATIVE
Glucose, UA: NEGATIVE mg/dL
Ketones, POC UA: NEGATIVE mg/dL
Nitrite, UA: NEGATIVE
Protein Ur, POC: NEGATIVE mg/dL
Spec Grav, UA: >=1.03 — AB (ref 1.010–1.025)
Urobilinogen, UA: 0.2 U/dL
pH, UA: 6 (ref 5.0–8.0)

## 2023-12-25 LAB — POCT URINE PREGNANCY: Preg Test, Ur: NEGATIVE

## 2023-12-25 MED ORDER — METRONIDAZOLE 500 MG PO TABS: 500.0000 mg | ORAL_TABLET | Freq: Two times a day (BID) | ORAL | 0 refills | Status: AC

## 2023-12-25 MED ORDER — FLUCONAZOLE 150 MG PO TABS: 150.0000 mg | ORAL_TABLET | Freq: Every day | ORAL | 0 refills | Status: AC

## 2023-12-25 NOTE — Discharge Instructions (Addendum)
Take the Diflucan and metronidazole as directed.    Your vaginal tests are pending.  If your test results are positive, we will call you.  You and your sexual partner(s) may require treatment at that time.  Do not have sexual activity for at least 7 days.    Follow up with your primary care provider or gynecologist if your symptoms are not improving.

## 2023-12-25 NOTE — ED Provider Notes (Signed)
 [facility]    CSN: [id number] Arrival date & time: 12/25/23  1257      History   Chief Complaint Chief Complaint  Patient presents with   Vaginal Discharge   Vaginitis    HPI Shelly Mccann is a 28 y.o. female.  Patient presents with malodorous vaginal discharge and vaginal irritation x 1 week.  Her symptoms started after she changed to a new soap.  She states this is similar to previous episodes of recurrent bacterial vaginitis and yeast vaginitis.  No treatments at home.  No fever, rash, abdominal pain, dysuria, hematuria, pelvic pain.  She is 5 months postpartum.  She is not breast-feeding.  The history is provided by the patient and medical records.    Past Medical History:  Diagnosis Date   Hidradenitis     Patient Active Problem List   Diagnosis Date Noted   Pregnant 07/28/2023   Preterm labor in third trimester with preterm delivery 07/28/2023   Transverse lie of fetus, fetus 2 07/28/2023   S/P cesarean section 07/28/2023   Gestational diabetes 07/28/2023   Dichorionic diamniotic twin gestation 05/11/2023   Marijuana use 05/11/2023   Vapes nicotine containing substance 05/11/2023   Supervision of high-risk pregnancy 05/07/2023    Past Surgical History:  Procedure Laterality Date   CESAREAN SECTION N/A 07/28/2023   Procedure: CESAREAN DELIVERY;  Surgeon: Ilean Norleen GAILS, MD;  Location: [facility];  Service: Obstetrics;  Laterality: N/A;   TONSILLECTOMY N/A 10/03/2013   Procedure: TONSILLECTOMY;  Surgeon: Ida Loader, MD;  Location: [facility];  Service: ENT;  Laterality: N/A;    OB History     Gravida  4   Para  1   Term      Preterm  1   AB  3   Living  2      SAB  1   IAB  2   Ectopic      Multiple  1   Live Births  2            Home Medications    Prior to Admission medications   Medication Sig Start Date End Date Taking? Authorizing Provider  fluconazole  (DIFLUCAN ) 150 MG tablet Take 1 tablet (150  mg total) by mouth daily. Take one tablet today.  May repeat in 3 days. 12/25/23  Yes Corlis Burnard DEL, NP  metroNIDAZOLE  (FLAGYL ) 500 MG tablet Take 1 tablet (500 mg total) by mouth 2 (two) times daily. 12/25/23  Yes Corlis Burnard DEL, NP  furosemide  (LASIX ) 20 MG tablet Take 1 tablet (20 mg total) by mouth daily. Patient not taking: Reported on 09/03/2023 08/01/23   Zina Jerilynn LABOR, MD  ibuprofen  (ADVIL ) 600 MG tablet Take 1 tablet (600 mg total) by mouth every 6 (six) hours. Patient not taking: Reported on 09/03/2023 07/31/23   Zina Jerilynn LABOR, MD  NIFEdipine  (ADALAT  CC) 30 MG 24 hr tablet Take 1 tablet (30 mg total) by mouth daily. Patient not taking: Reported on 09/03/2023 08/01/23   Zina Jerilynn LABOR, MD  Norethindrone-Ethinyl Estradiol-Fe Biphas (LO LOESTRIN FE ) 1 MG-10 MCG / 10 MCG tablet Take 1 tablet by mouth daily. Take 1 daily by mouth 09/10/23   Signa Delon LABOR, NP  oxyCODONE  (OXY IR/ROXICODONE ) 5 MG immediate release tablet Take 1-2 tablets (5-10 mg total) by mouth every 4 (four) hours as needed for moderate pain (pain score 4-6). Patient not taking: Reported on 09/03/2023 07/31/23   Zina Jerilynn LABOR, MD  potassium  chloride SA (KLOR-CON  M) 20 MEQ tablet Take 1 tablet (20 mEq total) by mouth daily. Patient not taking: Reported on 09/03/2023 08/01/23   Zina Jerilynn LABOR, MD    Family History Family History  Problem Relation Age of Onset   Healthy Mother    Healthy Father    Alcohol abuse Paternal Grandfather    Hypertension Paternal Grandfather     Social History Social History   Tobacco Use   Smoking status: Never   Smokeless tobacco: Never  Vaping Use   Vaping status: Every Day  Substance Use Topics   Alcohol use: Not Currently   Drug use: Yes    Types: Marijuana    Comment: occasionally     Allergies   Patient has no known allergies.   Review of Systems Review of Systems  Constitutional:  Negative for chills and fever.  Gastrointestinal:  Negative for abdominal pain,  diarrhea and vomiting.  Genitourinary:  Positive for vaginal discharge. Negative for dysuria, flank pain, hematuria and pelvic pain.     Physical Exam Triage Vital Signs ED Triage Vitals  Encounter Vitals Group     BP      Girls Systolic BP Percentile      Girls Diastolic BP Percentile      Boys Systolic BP Percentile      Boys Diastolic BP Percentile      Pulse      Resp      Temp      Temp src      SpO2      Weight      Height      Head Circumference      Peak Flow      Pain Score      Pain Loc      Pain Education      Exclude from Growth Chart    No data found.  Updated Vital Signs BP 112/79   Pulse 73   Temp (!) 97.5 F (36.4 C)   Resp 16   Ht 5' 9 (1.753 m)   Wt 195 lb (88.5 kg)   LMP 12/07/2023   SpO2 99%   Breastfeeding No   BMI 28.80 kg/m   Visual Acuity Right Eye Distance:   Left Eye Distance:   Bilateral Distance:    Right Eye Near:   Left Eye Near:    Bilateral Near:     Physical Exam Constitutional:      General: She is not in acute distress. HENT:     Mouth/Throat:     Mouth: Mucous membranes are moist.  Cardiovascular:     Rate and Rhythm: Normal rate and regular rhythm.     Heart sounds: Normal heart sounds.  Pulmonary:     Effort: Pulmonary effort is normal. No respiratory distress.     Breath sounds: Normal breath sounds.  Abdominal:     General: Bowel sounds are normal.     Palpations: Abdomen is soft.     Tenderness: There is no abdominal tenderness. There is no right CVA tenderness, left CVA tenderness, guarding or rebound.  Neurological:     Mental Status: She is alert.      UC Treatments / Results  Labs (all labs ordered are listed, but only abnormal results are displayed) Labs Reviewed  POCT URINE DIPSTICK - Abnormal; Notable for the following components:      Result Value   Spec Grav, UA >=1.030 (*)    Leukocytes, UA Trace (*)  All other components within normal limits  POCT URINE PREGNANCY - Normal   CERVICOVAGINAL ANCILLARY ONLY    EKG   Radiology No results found.  Procedures Procedures (including critical care time)  Medications Ordered in UC Medications - No data to display  Initial Impression / Assessment and Plan / UC Course  I have reviewed the triage vital signs and the nursing notes.  Pertinent labs & imaging results that were available during my care of the patient were reviewed by me and considered in my medical decision making (see chart for details).    Vaginal discharge, STD screening, negative pregnancy test.  Patient obtained vaginal self swab for testing.  Treating with Diflucan  and metronidazole .  Discussed that we will call if test results show the need for additional treatment.  Discussed that sexual partner(s) may also require treatment.  Instructed patient to abstain from sexual activity for at least 7 days.  Instructed her to follow-up with her PCP or gynecologist if her symptoms are not improving.  Patient agrees to plan of care.   Final Clinical Impressions(s) / UC Diagnoses   Final diagnoses:  Vaginal discharge  Screening for STD (sexually transmitted disease)  Negative pregnancy test     Discharge Instructions      Take the Diflucan  and metronidazole  as directed.  Your vaginal tests are pending.  If your test results are positive, we will call you.  You and your sexual partner(s) may require treatment at that time.  Do not have sexual activity for at least 7 days.    Follow-up with your primary care provider or gynecologist if your symptoms are not improving.      ED Prescriptions     Medication Sig Dispense Auth. Provider   fluconazole  (DIFLUCAN ) 150 MG tablet Take 1 tablet (150 mg total) by mouth daily. Take one tablet today.  May repeat in 3 days. 2 tablet Corlis Burnard DEL, NP   metroNIDAZOLE  (FLAGYL ) 500 MG tablet Take 1 tablet (500 mg total) by mouth 2 (two) times daily. 14 tablet Corlis Burnard DEL, NP      PDMP not reviewed this  encounter.   Corlis Burnard DEL, NP 12/25/23 1328

## 2023-12-25 NOTE — ED Triage Notes (Addendum)
 Patient in office today complaint of  Discomfort of vaginal area x  35mo  after pregnancy. whitish creamy discharge with odor x 1wk ago Also request STI check  Per patient also stated that she has been using a different brand of soap last wk. LMP 12/07/23  OTC: none Denies: fever, nausea

## 2023-12-28 ENCOUNTER — Ambulatory Visit (HOSPITAL_COMMUNITY): Payer: Self-pay

## 2023-12-28 LAB — CERVICOVAGINAL ANCILLARY ONLY
Bacterial Vaginitis (gardnerella): POSITIVE — AB
Candida Glabrata: NEGATIVE
Candida Vaginitis: NEGATIVE
Chlamydia: NEGATIVE
Comment: NEGATIVE
Comment: NEGATIVE
Comment: NEGATIVE
Comment: NEGATIVE
Comment: NEGATIVE
Comment: NORMAL
Neisseria Gonorrhea: NEGATIVE
Trichomonas: NEGATIVE

## 2023-12-31 ENCOUNTER — Ambulatory Visit: Admitting: Obstetrics & Gynecology
